# Patient Record
Sex: Male | Born: 1960
Health system: Southern US, Community
[De-identification: ages and names within clinical notes are randomized; demographics above are authoritative.]

## PROBLEM LIST (undated history)

## (undated) DIAGNOSIS — R5383 Other fatigue: Secondary | ICD-10-CM

## (undated) DIAGNOSIS — G4733 Obstructive sleep apnea (adult) (pediatric): Secondary | ICD-10-CM

## (undated) DIAGNOSIS — I480 Paroxysmal atrial fibrillation: Secondary | ICD-10-CM

## (undated) DIAGNOSIS — F419 Anxiety disorder, unspecified: Secondary | ICD-10-CM

## (undated) DIAGNOSIS — R42 Dizziness and giddiness: Secondary | ICD-10-CM

## (undated) DIAGNOSIS — E669 Obesity, unspecified: Secondary | ICD-10-CM

## (undated) DIAGNOSIS — Z789 Other specified health status: Secondary | ICD-10-CM

## (undated) DIAGNOSIS — K219 Gastro-esophageal reflux disease without esophagitis: Secondary | ICD-10-CM

## (undated) DIAGNOSIS — J302 Other seasonal allergic rhinitis: Secondary | ICD-10-CM

## (undated) DIAGNOSIS — E785 Hyperlipidemia, unspecified: Secondary | ICD-10-CM

## (undated) DIAGNOSIS — I483 Typical atrial flutter: Secondary | ICD-10-CM

## (undated) DIAGNOSIS — I5189 Other ill-defined heart diseases: Secondary | ICD-10-CM

## (undated) HISTORY — DX: Anxiety disorder, unspecified: F41.9

## (undated) HISTORY — DX: Other ill-defined heart diseases: I51.89

## (undated) HISTORY — DX: Other specified health status: Z78.9

## (undated) HISTORY — PX: COLONOSCOPY: SHX174

## (undated) HISTORY — DX: Dizziness and giddiness: R42

## (undated) HISTORY — DX: Other seasonal allergic rhinitis: J30.2

## (undated) HISTORY — DX: Typical atrial flutter: I48.3

## (undated) HISTORY — DX: Paroxysmal atrial fibrillation: I48.0

## (undated) HISTORY — DX: Obesity, unspecified: E66.9

## (undated) HISTORY — DX: Obstructive sleep apnea (adult) (pediatric): G47.33

## (undated) HISTORY — DX: Hyperlipidemia, unspecified: E78.5

## (undated) HISTORY — DX: Other fatigue: R53.83

---

## 2010-09-30 HISTORY — PX: CARDIAC CATHETERIZATION: SHX172

## 2010-09-30 HISTORY — PX: CHEST TUBE INSERTION: SHX231

## 2011-01-16 ENCOUNTER — Emergency Department (HOSPITAL_COMMUNITY): Payer: PRIVATE HEALTH INSURANCE

## 2011-01-16 ENCOUNTER — Emergency Department (HOSPITAL_COMMUNITY)
Admission: EM | Admit: 2011-01-16 | Discharge: 2011-01-17 | Disposition: A | Discharge status: Home / Self Care | Payer: PRIVATE HEALTH INSURANCE | Attending: Emergency Medicine | Admitting: Emergency Medicine

## 2011-01-16 DIAGNOSIS — I498 Other specified cardiac arrhythmias: Secondary | ICD-10-CM | POA: Insufficient documentation

## 2011-01-16 DIAGNOSIS — R0602 Shortness of breath: Secondary | ICD-10-CM | POA: Insufficient documentation

## 2011-01-16 DIAGNOSIS — R079 Chest pain, unspecified: Secondary | ICD-10-CM | POA: Insufficient documentation

## 2011-01-16 LAB — CBC
Platelets: 204 10*3/uL (ref 150–400)
RBC: 5.14 MIL/uL (ref 4.22–5.81)
RDW: 12.5 % (ref 11.5–15.5)
WBC: 7.6 10*3/uL (ref 4.0–10.5)

## 2011-01-16 LAB — COMPREHENSIVE METABOLIC PANEL
Albumin: 4.1 g/dL (ref 3.5–5.2)
Alkaline Phosphatase: 67 U/L (ref 39–117)
BUN: 16 mg/dL (ref 6–23)
Calcium: 9.6 mg/dL (ref 8.4–10.5)
Creatinine, Ser: 0.96 mg/dL (ref 0.4–1.5)
Glucose, Bld: 102 mg/dL — ABNORMAL HIGH (ref 70–99)
Potassium: 3.5 mEq/L (ref 3.5–5.1)
Total Protein: 7.2 g/dL (ref 6.0–8.3)

## 2011-01-16 LAB — POCT CARDIAC MARKERS
CKMB, poc: 2.6 ng/mL (ref 1.0–8.0)
Myoglobin, poc: 71 ng/mL (ref 12–200)
Troponin i, poc: <0.05 ng/mL (ref 0.00–0.09)

## 2011-01-16 LAB — LIPASE, BLOOD: Lipase: 36 U/L (ref 11–59)

## 2011-01-16 LAB — DIFFERENTIAL
Basophils Absolute: 0 10*3/uL (ref 0.0–0.1)
Basophils Relative: 0 % (ref 0–1)
Eosinophils Absolute: 0.2 10*3/uL (ref 0.0–0.7)
Eosinophils Relative: 3 % (ref 0–5)
Neutrophils Relative %: 50 % (ref 43–77)

## 2011-01-17 ENCOUNTER — Telehealth: Payer: Self-pay | Admitting: Internal Medicine

## 2011-01-17 LAB — POCT CARDIAC MARKERS
Myoglobin, poc: 49.6 ng/mL (ref 12–200)
Troponin i, poc: <0.05 ng/mL (ref 0.00–0.09)

## 2011-01-17 NOTE — Telephone Encounter (Signed)
Reviewed hospital ED note which states pt needs to have a stress test. Note does not indicate if this is a regular stress test or nuclear stress test.  Will check with schedulers tomorrow to see if voicemail indicates type of test to be done.  I called pt and told him we would determine tomorrow what type of stress test he needs and call him to schedule.  Pt's phone number is [phone]

## 2011-01-18 NOTE — Telephone Encounter (Signed)
I will forward this information to Dr Gala Romney to review and make recommendation about what type of stress test needs to be performed. I spoke with the schedulers and they did not receive any after hour messages about this patient.

## 2011-01-23 NOTE — Telephone Encounter (Signed)
Per Dr Gala Romney pt needs a GXT can be done w/PA, order placed

## 2011-01-26 ENCOUNTER — Encounter: Payer: Self-pay | Admitting: Physician Assistant

## 2011-02-01 ENCOUNTER — Encounter: Payer: Self-pay | Admitting: *Deleted

## 2011-02-01 ENCOUNTER — Ambulatory Visit (INDEPENDENT_AMBULATORY_CARE_PROVIDER_SITE_OTHER): Payer: PRIVATE HEALTH INSURANCE | Admitting: Physician Assistant

## 2011-02-01 ENCOUNTER — Encounter: Payer: Self-pay | Admitting: Physician Assistant

## 2011-02-01 DIAGNOSIS — R079 Chest pain, unspecified: Secondary | ICD-10-CM | POA: Insufficient documentation

## 2011-02-01 DIAGNOSIS — R9439 Abnormal result of other cardiovascular function study: Secondary | ICD-10-CM | POA: Insufficient documentation

## 2011-02-01 LAB — CBC WITH DIFFERENTIAL/PLATELET
Basophils Absolute: 0 10*3/uL (ref 0.0–0.1)
Basophils Relative: 0.4 % (ref 0.0–3.0)
Eosinophils Absolute: 0.1 10*3/uL (ref 0.0–0.7)
Lymphocytes Relative: 35.4 % (ref 12.0–46.0)
MCHC: 35 g/dL (ref 30.0–36.0)
Monocytes Relative: 8.8 % (ref 3.0–12.0)
Neutrophils Relative %: 53 % (ref 43.0–77.0)
RBC: 4.98 Mil/uL (ref 4.22–5.81)
RDW: 13.4 % (ref 11.5–14.6)

## 2011-02-01 LAB — BASIC METABOLIC PANEL
CO2: 24 mEq/L (ref 19–32)
Calcium: 9.8 mg/dL (ref 8.4–10.5)
Creatinine, Ser: 1.3 mg/dL (ref 0.4–1.5)
GFR: 65.47 mL/min (ref >60.00)

## 2011-02-01 LAB — PROTIME-INR: INR: 1.1 ratio — ABNORMAL HIGH (ref 0.8–1.0)

## 2011-02-01 MED ORDER — NITROGLYCERIN 0.4 MG SL SUBL: 0.4000 mg | SUBLINGUAL_TABLET | SUBLINGUAL | Status: DC | PRN

## 2011-02-01 MED ORDER — METOPROLOL TARTRATE 25 MG PO TABS: 25.0000 mg | ORAL_TABLET | Freq: Two times a day (BID) | ORAL | Status: DC

## 2011-02-01 NOTE — Progress Notes (Signed)
Exercise Treadmill Test Patient seen in ED with chest pain.  Cardiac enzymes negative.  He was referred today for ETT.    Past Medical History  Diagnosis Date  . Seasonal allergies     No past surgical history on file.  No Known Allergies  History  Substance Use Topics  . Smoking status: Never Smoker   . Smokeless tobacco: Not on file  . Alcohol Use: No    Family History  Problem Relation Age of Onset  . Coronary artery disease    . Heart attack Father 37  . Heart disease Father   . Diabetes type II Maternal Grandfather     Review of Systems - No chest pain since ER visit.  All other systems reviewed and negative.  PHYSICAL EXAM: Well nourished, well developed, in no acute distress HEENT: normal Neck: no JVD Cardiac:  normal S1, S2; RRR; no murmur Lungs:  clear to auscultation bilaterally, no wheezing, rhonchi or rales Abd: soft, nontender Ext: no edema Skin: warm and dry Neuro:  CNs 2-12 intact, no focal abnormalities noted   Pre-Exercise Testing Evaluation Rhythm: normal sinus  Rate: 72   PR:  .19 QRS:  .09    Test  Exercise Tolerance Test Ordering MD: Arvilla Meres, MD  Interpreting MD:  Tereso Newcomer PC-C  Unique Test No: 1  Treadmill:  1  Indication for ETT: chest pain - rule out ischemia  Contraindication to ETT: No   Stress Modality: exercise - treadmill  Cardiac Imaging Performed: non   Protocol: standard Bruce - maximal  Max BP:  180/79  Max MPHR (bpm):  172 85% MPR (bpm):  146  MPHR obtained (bpm):  160 % MPHR obtained:  95%  Reached 85% MPHR (min:sec):  7:03 Total Exercise Time (min-sec):  9:00  Workload in METS:  10.4 Borg Scale: 15  Reason ETT Terminated:  patient's desire to stop    ST Segment Analysis At Rest: non-specific ST segment slurring With Exercise: significant ischemic ST depression  Other Information Arrhythmia:  No Angina during ETT:  present (1) Quality of ETT:  diagnostic  ETT Interpretation:  abnormal - evidence of ST  depression consistent with ischemia  Comments: Good exercise tolerance. Hypertensive blood pressure response. Patient had chest pressure at end of test and some nausea. 1-2 mm ST depression in V3-5 with maximal exercise. EKG changes resolved in recovery as well as symptoms.  Recommendations: Patient will need cardiac cath. Discussed with Dr. Gala Romney who reviewed EKGs. Risks and benefits of cardiac catheterization have been discussed with the patient.  These include bleeding, infection, kidney damage, stroke, heart attack, death.  The patient understands these risks and is willing to proceed.

## 2011-02-01 NOTE — Patient Instructions (Signed)
Labs today Your physician has requested that you have a cardiac catheterization. Cardiac catheterization is used to diagnose and/or treat various heart conditions. Doctors may recommend this procedure for a number of different reasons. The most common reason is to evaluate chest pain. Chest pain can be a symptom of coronary artery disease (CAD), and cardiac catheterization can show whether plaque is narrowing or blocking your heart's arteries. This procedure is also used to evaluate the valves, as well as measure the blood flow and oxygen levels in different parts of your heart. For further information please visit [email or web address] Please follow instruction sheet, as given.

## 2011-02-01 NOTE — Assessment & Plan Note (Signed)
As noted, ETT positive. Proceed with cardiac cath. Will start on ASA, metoprolol and NTG prn.

## 2011-02-05 ENCOUNTER — Inpatient Hospital Stay (HOSPITAL_BASED_OUTPATIENT_CLINIC_OR_DEPARTMENT_OTHER)
Admission: RE | Admit: 2011-02-05 | Discharge: 2011-02-05 | Disposition: A | Discharge status: Home / Self Care | Payer: PRIVATE HEALTH INSURANCE | Source: Ambulatory Visit | Attending: Internal Medicine | Admitting: Internal Medicine

## 2011-02-05 DIAGNOSIS — I2510 Atherosclerotic heart disease of native coronary artery without angina pectoris: Secondary | ICD-10-CM | POA: Insufficient documentation

## 2011-02-05 DIAGNOSIS — R079 Chest pain, unspecified: Secondary | ICD-10-CM | POA: Insufficient documentation

## 2011-02-05 DIAGNOSIS — Z8249 Family history of ischemic heart disease and other diseases of the circulatory system: Secondary | ICD-10-CM | POA: Insufficient documentation

## 2011-02-05 DIAGNOSIS — R9439 Abnormal result of other cardiovascular function study: Secondary | ICD-10-CM | POA: Insufficient documentation

## 2011-02-19 ENCOUNTER — Encounter: Payer: Self-pay | Admitting: *Deleted

## 2011-02-20 ENCOUNTER — Encounter: Payer: PRIVATE HEALTH INSURANCE | Admitting: Physician Assistant

## 2011-02-20 NOTE — Cardiovascular Report (Signed)
  NAME:  Larry Mccormick, Larry Mccormick          ACCOUNT NO.:  [id number]  MEDICAL RECORD NO.:  [id number]           PATIENT TYPE:  E  LOCATION:  [facility]LED                         FACILITY[facility]  [facility]  PHYSICIAN:  Bevelyn Buckles. Bensimhon, MDDATE OF BIRTH:  01-08-1961  DATE OF PROCEDURE:  02/05/2011 DATE OF DISCHARGE:  01/17/2011                           CARDIAC CATHETERIZATION   PATIENT IDENTIFICATION:  Larry Mccormick is a 50 year old male who was seen in the emergency room with chest pain.  He has a family history of coronary artery disease.  We set him up for an outpatient stress test, which was mildly positive.  He is thus referred for cardiac catheterization.  PROCEDURES PERFORMED: 1. Selective coronary angiography. 2. Left heart cath. 3. Left ventriculogram.  DESCRIPTION OF PROCEDURE:  The risks and indications were explained. Consent was signed and placed on the chart.  Allen's test was checked which was normal on the right.  We then prepped and draped the right wrist area in routine sterile fashion, anesthetized with 1% local lidocaine.  A 5-French arterial sheath was placed using modified Seldinger technique.  Once the sheath was in, we gave him 5000 units of systemic heparin and 3 mg of intra-arterial verapamil.  We used a JL- 3.5, a Williams right, and a straight pigtail catheter.  All catheter exchanges were made over wire.  There were no apparent complications.  Central aortic pressure 96/64 with a mean of 79.  LV pressure 98/19 with EDP of 23.  There was no aortic stenosis.  Left main was normal.  LAD was a long vessel coursing the apex.  It gave off a tiny diagonal branch, it was angiographically normal.  Left circumflex gave off a ramus branch and three marginal branches, it was angiographically normal.  Right coronary artery was a large dominant vessel, which appeared to have an anterior takeoff.  It was difficult to cannulate.  Most of the images were taken nonselectively.  It  gave off a large acute marginal PDA.  There was a 20% lesion proximally with somewhat of a bend proximally.  There did not appear to be a high-grade stenosis. Otherwise, the right coronary artery was normal.  Left ventriculogram done in the RAO position showed an EF of 55% with no regional wall motion abnormalities.  ASSESSMENT: 1. Minimal nonobstructive coronary artery disease. 2. Normal left ventricular function with mildly increased left     ventricular end-diastolic pressure.  PLAN/DISCUSSION:  Based on his catheterization, I think his stress test is likely a false positive.  We will continue with aggressive risk factor management.     Bevelyn Buckles. Bensimhon, MD     DRB/MEDQ  D:  02/05/2011  T:  02/06/2011  Job:  [id number]  Electronically Signed by Arvilla Meres MD on 02/20/2011 09:33:22 PM

## 2013-08-04 ENCOUNTER — Encounter: Payer: Self-pay | Admitting: Cardiology

## 2013-08-04 ENCOUNTER — Encounter: Payer: Self-pay | Admitting: *Deleted

## 2013-08-04 DIAGNOSIS — E785 Hyperlipidemia, unspecified: Secondary | ICD-10-CM | POA: Insufficient documentation

## 2013-08-04 DIAGNOSIS — G4733 Obstructive sleep apnea (adult) (pediatric): Secondary | ICD-10-CM | POA: Insufficient documentation

## 2013-08-04 DIAGNOSIS — F419 Anxiety disorder, unspecified: Secondary | ICD-10-CM | POA: Insufficient documentation

## 2013-08-04 DIAGNOSIS — R42 Dizziness and giddiness: Secondary | ICD-10-CM | POA: Insufficient documentation

## 2013-08-05 ENCOUNTER — Encounter: Payer: Self-pay | Admitting: Cardiology

## 2013-08-05 ENCOUNTER — Ambulatory Visit (INDEPENDENT_AMBULATORY_CARE_PROVIDER_SITE_OTHER): Payer: BC Managed Care – PPO | Admitting: Cardiology

## 2013-08-05 VITALS — BP 120/75 | HR 66 | Ht 71.0 in | Wt 225.0 lb

## 2013-08-05 DIAGNOSIS — E669 Obesity, unspecified: Secondary | ICD-10-CM

## 2013-08-05 DIAGNOSIS — I480 Paroxysmal atrial fibrillation: Secondary | ICD-10-CM | POA: Insufficient documentation

## 2013-08-05 DIAGNOSIS — G4733 Obstructive sleep apnea (adult) (pediatric): Secondary | ICD-10-CM

## 2013-08-05 DIAGNOSIS — I4891 Unspecified atrial fibrillation: Secondary | ICD-10-CM

## 2013-08-05 MED ORDER — METOPROLOL TARTRATE 25 MG PO TABS: 12.5000 mg | ORAL_TABLET | Freq: Two times a day (BID) | ORAL | Status: DC

## 2013-08-05 NOTE — Progress Notes (Signed)
[address] [place], [place]  [address] Phone: [phone] Fax:  [phone]  Date:  08/05/2013   ID:  Larry Mccormick, DOB 07/30/1961, MRN [id number]  PCP:  Mickie Hillier, MD  Cardiologist:  Armanda Magic, MD     History of Present Illness: Larry Mccormick is a 52 y.o. male with a history of OSA on CPAP, obesity and PAF.  He is doing well.  He denies any chest pain, SOB, DOE, LE edema, dizziness, palpitations or syncope.  He tolerates his CPAP well.  He tolerates the nasal mask and feels the pressure is adequate.  He has no daytime sleepiness but has not felt as rested when he gets up in the am as he had in the past.  He says that he has been snoring some and has not been sleeping as well.  He says that the mask is leaking some and the strap is getting too loose.  He has not been using his chin strap.   Wt Readings from Last 3 Encounters:  No data found for Wt     Past Medical History  Diagnosis Date  . Seasonal allergies   . Chest pain   . Anxiety   . OSA (obstructive sleep apnea)      on CPAP machine, follows with Dr. Mayford Knife  . Hyperlipidemia   . Atrial fibrillation     08/2011 [facility]- transient, following with Dr. Mayford Knife  . Vertigo   . Sleep apnea     Current Outpatient Prescriptions  Medication Sig Dispense Refill  . aspirin 81 MG tablet Take 81 mg by mouth as needed.        . DiphenhydrAMINE HCl (BENADRYL ALLERGY PO) Take by mouth as needed.        . metoprolol tartrate (LOPRESSOR) 25 MG tablet Take 1 tablet (25 mg total) by mouth 2 (two) times daily.  60 tablet  11  . nitroGLYCERIN (NITROSTAT) 0.4 MG SL tablet Place 1 tablet (0.4 mg total) under the tongue every 5 (five) minutes as needed for chest pain.  25 tablet  11   No current facility-administered medications for this visit.    Allergies:    Allergies  Allergen Reactions  . Succinylcholine Chloride Anaphylaxis  . Vicodin [Hydrocodone-Acetaminophen] Nausea And Vomiting      Social History:  The patient  reports that he has never smoked. He does not have any smokeless tobacco history on file. He reports that he does not drink alcohol or use illicit drugs.   Family History:  The patient's family history includes Coronary artery disease in an other family member; Diabetes type II in his maternal grandfather; Heart attack (age of onset: 60) in his father; Heart disease in his father.   ROS:  Please see the history of present illness.      All other systems reviewed and negative.   PHYSICAL EXAM: VS:  There were no vitals taken for this visit. Well nourished, well developed, in no acute distress HEENT: normal Neck: no JVD Cardiac:  normal S1, S2; RRR; no murmur Lungs:  clear to auscultation bilaterally, no wheezing, rhonchi or rales Abd: soft, nontender, no hepatomegaly Ext: no edema Skin: warm and dry Neuro:  CNs 2-12 intact, no focal abnormalities noted  EKG:       ASSESSMENT AND PLAN:  1. OSA on CPAP - his d/l today showed an AHI of 1.2/hr and 96% compliance in using more than 4 hours nightly  - I have  recommended that he take his mask to [facility] and see if he needs a new cushion as well as new straps.    - I encouraged him to use his chin strap and hopefully his snoring will resolve - if it doesn't I ask him to let me know 2. PAF with no reoccurence  - continue Metoprolol 3. Obesity   - I have encouraged him to try to get at least 30 -45 minutes of aerobic exercise 5 times weekly  Followup with me in 6 months  Signed, Armanda Magic, MD 08/05/2013 10:30 AM

## 2013-08-05 NOTE — Patient Instructions (Signed)
Your physician wants you to follow-up in: 6 months with Dr. Turner. You will receive a reminder letter in the mail two months in advance. If you don't receive a letter, please call our office to schedule the follow-up appointment.  

## 2013-08-13 ENCOUNTER — Telehealth: Payer: Self-pay | Admitting: Cardiology

## 2013-08-13 ENCOUNTER — Other Ambulatory Visit: Payer: Self-pay | Admitting: *Deleted

## 2013-08-13 MED ORDER — METOPROLOL TARTRATE 25 MG PO TABS: 12.5000 mg | ORAL_TABLET | Freq: Two times a day (BID) | ORAL | Status: DC

## 2013-08-13 NOTE — Telephone Encounter (Deleted)
Error:  Transferred to pt advocate team

## 2013-08-16 ENCOUNTER — Encounter: Payer: Self-pay | Admitting: Cardiology

## 2013-08-16 NOTE — Telephone Encounter (Signed)
Please let patient know that CPAP download was fine

## 2013-08-17 NOTE — Telephone Encounter (Signed)
Pt.notified

## 2013-09-29 ENCOUNTER — Other Ambulatory Visit: Payer: Self-pay | Admitting: General Surgery

## 2013-09-29 DIAGNOSIS — G4733 Obstructive sleep apnea (adult) (pediatric): Secondary | ICD-10-CM

## 2014-03-01 ENCOUNTER — Other Ambulatory Visit: Payer: Self-pay | Admitting: Orthopedic Surgery

## 2014-03-02 ENCOUNTER — Encounter (HOSPITAL_BASED_OUTPATIENT_CLINIC_OR_DEPARTMENT_OTHER): Payer: Self-pay | Admitting: *Deleted

## 2014-03-02 NOTE — Progress Notes (Signed)
To come in for ekg-bmet-hx af post fall-cath 2012-normal

## 2014-03-02 NOTE — Progress Notes (Signed)
To bring cpap and will use post op 

## 2014-03-03 ENCOUNTER — Other Ambulatory Visit: Payer: Self-pay

## 2014-03-03 ENCOUNTER — Encounter (HOSPITAL_BASED_OUTPATIENT_CLINIC_OR_DEPARTMENT_OTHER)
Admission: RE | Admit: 2014-03-03 | Discharge: 2014-03-03 | Disposition: A | Discharge status: Home / Self Care | Payer: BC Managed Care – PPO | Source: Ambulatory Visit | Attending: Orthopedic Surgery | Admitting: Orthopedic Surgery

## 2014-03-03 DIAGNOSIS — Z01810 Encounter for preprocedural cardiovascular examination: Secondary | ICD-10-CM | POA: Insufficient documentation

## 2014-03-03 LAB — BASIC METABOLIC PANEL
BUN: 15 mg/dL (ref 6–23)
CALCIUM: 9.8 mg/dL (ref 8.4–10.5)
CO2: 27 meq/L (ref 19–32)
CREATININE: 1 mg/dL (ref 0.50–1.35)
Chloride: 103 mEq/L (ref 96–112)
GFR calc Af Amer: >90 mL/min (ref >90)
GFR calc non Af Amer: 85 mL/min — ABNORMAL LOW (ref >90)
Glucose, Bld: 83 mg/dL (ref 70–99)
Potassium: 4.2 mEq/L (ref 3.7–5.3)
Sodium: 140 mEq/L (ref 137–147)

## 2014-03-03 NOTE — H&P (Signed)
Larry Mccormick is an 53 y.o. male.   Chief Complaint: Left knee pain  HPI: Larry Mccormick presents with a chief complaint of left knee pain.  Larry Mccormick states that she's noticed increasing left knee pain over the past several weeks.  Larry Mccormick states that Larry Mccormick's had constant pain at times and severe pain.  Larry Mccormick describes as throbbing.  Larry Mccormick has noticed some swelling which comes and goes.  It is worse with activity and wakes him from sleep at times.  Larry Mccormick has noticed that the meloxicam helps more so with his right knee pain but not with his left knee pain.  Larry Mccormick does have a prior history of right knee injury.  Past Medical History  Diagnosis Date  . Seasonal allergies   . Chest pain   . Anxiety   . OSA (obstructive sleep apnea)     PSG AHI 31.71/hr now on CPAP at 9cm H2O  . Hyperlipidemia   . Vertigo   . Sleep apnea   . Obesity (BMI 30-39.9)   . Atrial fibrillation     [facility] following with Dr. Mayford Knifeurner  . Paroxysmal atrial fibrillation   . Diastolic dysfunction   . Hypertension   . Dysrhythmia     hx PAF-2012  . GERD (gastroesophageal reflux disease)     occ pepcid    Past Surgical History  Procedure Laterality Date  . Chest tube insertion  2012    right post a fall-no problems post  . Colonoscopy      no problems post  . Cardiac catheterization  2012    nonobstructive ASCAD     Family History  Problem Relation Age of Onset  . Coronary artery disease    . Heart attack Father [id number]  . Heart disease Father   . Diabetes type II Maternal Grandfather    Social History:  reports that Larry Mccormick has never smoked. Larry Mccormick does not have any smokeless tobacco history on file. Larry Mccormick reports that Larry Mccormick drinks alcohol. Larry Mccormick reports that Larry Mccormick does not use illicit drugs.  Allergies:  Allergies  Allergen Reactions  . Succinylcholine Chloride Anaphylaxis    After cath 2012-felt very weak and bp dropped  . Vicodin [Hydrocodone-Acetaminophen] Nausea And Vomiting    No prescriptions prior  to admission    No results found for this or any previous visit (from the past 48 hour(s)). No results found.  Review of Systems  Constitutional: Negative.   HENT: Negative.   Eyes: Negative.   Respiratory:       Sleep apnea  Cardiovascular: Negative.   Gastrointestinal: Negative.   Genitourinary: Negative.   Musculoskeletal: Positive for joint pain.  Skin: Negative.   Neurological: Negative.   Endo/Heme/Allergies: Negative.   Psychiatric/Behavioral: Negative.     There were no vitals taken for this visit. Physical Exam  Constitutional: Larry Mccormick is oriented to person, place, and time. Larry Mccormick appears well-developed and well-nourished.  HENT:  Head: Normocephalic and atraumatic.  Eyes: Pupils are equal, round, and reactive to light.  Neck: Normal range of motion. Neck supple.  Cardiovascular: Intact distal pulses.   Respiratory: Effort normal.  Musculoskeletal: Larry Mccormick exhibits tenderness.  Today, the Larry Mccormick has a range from 0-125.  Larry Mccormick does have a mild effusion.  No erythema or warmth.  Larry Mccormick continues to have medial joint line tenderness.  Neurological: Larry Mccormick is alert and oriented to person, place, and time.  Skin: Skin is warm and dry.  Psychiatric: Larry Mccormick has a normal mood and affect. His behavior is normal. Judgment  and thought content normal.     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  Larry Mccormick wishes to proceed with surgery.  The benefits risks and potential complications of surgery are discussed with the Larry Mccormick.  This includes injury to nerves and tissue, infection, and postoperative blood clots.  Larry Mccormick wishes to proceed with surgery.  A posting slip is completed and the Larry Mccormick is to discuss scheduling with Agustin Cree.  Call with any issues.  Larry Mccormick is given a prescription for tramadol 50 mg twice daily as needed for pain.  Allena Katz 03/03/2014, 10:28 AM

## 2014-03-07 ENCOUNTER — Ambulatory Visit (HOSPITAL_BASED_OUTPATIENT_CLINIC_OR_DEPARTMENT_OTHER): Payer: BC Managed Care – PPO | Admitting: Anesthesiology

## 2014-03-07 ENCOUNTER — Encounter (HOSPITAL_BASED_OUTPATIENT_CLINIC_OR_DEPARTMENT_OTHER): Payer: Self-pay | Admitting: *Deleted

## 2014-03-07 ENCOUNTER — Encounter (HOSPITAL_BASED_OUTPATIENT_CLINIC_OR_DEPARTMENT_OTHER): Payer: BC Managed Care – PPO | Admitting: Anesthesiology

## 2014-03-07 ENCOUNTER — Encounter (HOSPITAL_BASED_OUTPATIENT_CLINIC_OR_DEPARTMENT_OTHER)
Admission: RE | Disposition: A | Discharge status: Home / Self Care | Payer: Self-pay | Source: Ambulatory Visit | Attending: Orthopedic Surgery

## 2014-03-07 ENCOUNTER — Ambulatory Visit (HOSPITAL_BASED_OUTPATIENT_CLINIC_OR_DEPARTMENT_OTHER)
Admission: RE | Admit: 2014-03-07 | Discharge: 2014-03-07 | Disposition: A | Discharge status: Home / Self Care | Payer: BC Managed Care – PPO | Source: Ambulatory Visit | Attending: Orthopedic Surgery | Admitting: Orthopedic Surgery

## 2014-03-07 DIAGNOSIS — K219 Gastro-esophageal reflux disease without esophagitis: Secondary | ICD-10-CM | POA: Insufficient documentation

## 2014-03-07 DIAGNOSIS — F411 Generalized anxiety disorder: Secondary | ICD-10-CM | POA: Insufficient documentation

## 2014-03-07 DIAGNOSIS — Z6831 Body mass index (BMI) 31.0-31.9, adult: Secondary | ICD-10-CM | POA: Insufficient documentation

## 2014-03-07 DIAGNOSIS — Z885 Allergy status to narcotic agent status: Secondary | ICD-10-CM | POA: Insufficient documentation

## 2014-03-07 DIAGNOSIS — Z888 Allergy status to other drugs, medicaments and biological substances status: Secondary | ICD-10-CM | POA: Insufficient documentation

## 2014-03-07 DIAGNOSIS — I4891 Unspecified atrial fibrillation: Secondary | ICD-10-CM | POA: Insufficient documentation

## 2014-03-07 DIAGNOSIS — G4733 Obstructive sleep apnea (adult) (pediatric): Secondary | ICD-10-CM | POA: Insufficient documentation

## 2014-03-07 DIAGNOSIS — S83207A Unspecified tear of unspecified meniscus, current injury, left knee, initial encounter: Secondary | ICD-10-CM

## 2014-03-07 DIAGNOSIS — IMO0002 Reserved for concepts with insufficient information to code with codable children: Secondary | ICD-10-CM | POA: Insufficient documentation

## 2014-03-07 DIAGNOSIS — M2240 Chondromalacia patellae, unspecified knee: Secondary | ICD-10-CM | POA: Insufficient documentation

## 2014-03-07 DIAGNOSIS — E785 Hyperlipidemia, unspecified: Secondary | ICD-10-CM | POA: Insufficient documentation

## 2014-03-07 DIAGNOSIS — I10 Essential (primary) hypertension: Secondary | ICD-10-CM | POA: Insufficient documentation

## 2014-03-07 HISTORY — DX: Gastro-esophageal reflux disease without esophagitis: K21.9

## 2014-03-07 HISTORY — PX: KNEE ARTHROSCOPY: SHX127

## 2014-03-07 LAB — POCT HEMOGLOBIN-HEMACUE: Hemoglobin: 15.4 g/dL (ref 13.0–17.0)

## 2014-03-07 SURGERY — ARTHROSCOPY, KNEE
Anesthesia: General | Site: Knee | Laterality: Left

## 2014-03-07 MED ORDER — PROPOFOL INFUSION 10 MG/ML OPTIME
INTRAVENOUS | Status: DC | PRN
  Administered 2014-03-07: 150 mL via INTRAVENOUS

## 2014-03-07 MED ORDER — FENTANYL CITRATE 0.05 MG/ML IJ SOLN
INTRAMUSCULAR | Status: AC
  Filled 2014-03-07: qty 6

## 2014-03-07 MED ORDER — LACTATED RINGERS IV SOLN
INTRAVENOUS | Status: DC
  Administered 2014-03-07: 13:00:00 via INTRAVENOUS

## 2014-03-07 MED ORDER — CEFAZOLIN SODIUM-DEXTROSE 2-3 GM-% IV SOLR
INTRAVENOUS | Status: AC
  Filled 2014-03-07: qty 50

## 2014-03-07 MED ORDER — KETOROLAC TROMETHAMINE 30 MG/ML IJ SOLN
INTRAMUSCULAR | Status: DC | PRN
  Administered 2014-03-07: 30 mg via INTRAVENOUS

## 2014-03-07 MED ORDER — ONDANSETRON HCL 4 MG/2ML IJ SOLN
INTRAMUSCULAR | Status: DC | PRN
  Administered 2014-03-07 (×2): 4 mg via INTRAVENOUS

## 2014-03-07 MED ORDER — CEFAZOLIN SODIUM-DEXTROSE 2-3 GM-% IV SOLR
2.0000 g | INTRAVENOUS | Status: AC
  Administered 2014-03-07: 2 g via INTRAVENOUS

## 2014-03-07 MED ORDER — FENTANYL CITRATE 0.05 MG/ML IJ SOLN: 50.0000 ug | INTRAMUSCULAR | Status: DC | PRN

## 2014-03-07 MED ORDER — BUPIVACAINE-EPINEPHRINE 0.5% -1:200000 IJ SOLN
INTRAMUSCULAR | Status: DC | PRN
  Administered 2014-03-07: 20 mL

## 2014-03-07 MED ORDER — MIDAZOLAM HCL 5 MG/5ML IJ SOLN
INTRAMUSCULAR | Status: DC | PRN
  Administered 2014-03-07: 2 mg via INTRAVENOUS

## 2014-03-07 MED ORDER — OXYCODONE-ACETAMINOPHEN 5-325 MG PO TABS: 1.0000 | ORAL_TABLET | ORAL | Status: DC | PRN

## 2014-03-07 MED ORDER — ONDANSETRON HCL 4 MG/2ML IJ SOLN: 4.0000 mg | Freq: Once | INTRAMUSCULAR | Status: DC | PRN

## 2014-03-07 MED ORDER — FENTANYL CITRATE 0.05 MG/ML IJ SOLN
INTRAMUSCULAR | Status: DC | PRN
  Administered 2014-03-07 (×2): 100 ug via INTRAVENOUS

## 2014-03-07 MED ORDER — OXYCODONE HCL 5 MG/5ML PO SOLN: 5.0000 mg | Freq: Once | ORAL | Status: AC | PRN

## 2014-03-07 MED ORDER — MIDAZOLAM HCL 2 MG/2ML IJ SOLN
INTRAMUSCULAR | Status: AC
  Filled 2014-03-07: qty 2

## 2014-03-07 MED ORDER — OXYCODONE HCL 5 MG PO TABS
ORAL_TABLET | ORAL | Status: AC
  Filled 2014-03-07: qty 1

## 2014-03-07 MED ORDER — HYDROMORPHONE HCL PF 1 MG/ML IJ SOLN: 0.2500 mg | INTRAMUSCULAR | Status: DC | PRN

## 2014-03-07 MED ORDER — MIDAZOLAM HCL 2 MG/2ML IJ SOLN
1.0000 mg | INTRAMUSCULAR | Status: DC | PRN
Start: 2014-03-07 — End: 2014-03-07

## 2014-03-07 MED ORDER — DEXTROSE-NACL 5-0.45 % IV SOLN: INTRAVENOUS | Status: DC

## 2014-03-07 MED ORDER — LIDOCAINE HCL (CARDIAC) 20 MG/ML IV SOLN
INTRAVENOUS | Status: DC | PRN
  Administered 2014-03-07: 100 mg via INTRAVENOUS

## 2014-03-07 MED ORDER — OXYCODONE HCL 5 MG PO TABS
5.0000 mg | ORAL_TABLET | Freq: Once | ORAL | Status: AC | PRN
  Administered 2014-03-07: 5 mg via ORAL

## 2014-03-07 MED ORDER — DEXAMETHASONE SODIUM PHOSPHATE 10 MG/ML IJ SOLN
INTRAMUSCULAR | Status: DC | PRN
  Administered 2014-03-07: 10 mg via INTRAVENOUS

## 2014-03-07 MED ORDER — EPINEPHRINE HCL 1 MG/ML IJ SOLN
INTRAMUSCULAR | Status: DC | PRN
  Administered 2014-03-07: 1 mg via SUBCUTANEOUS

## 2014-03-07 MED ORDER — CHLORHEXIDINE GLUCONATE 4 % EX LIQD: 60.0000 mL | Freq: Once | CUTANEOUS | Status: DC

## 2014-03-07 MED ORDER — MEPERIDINE HCL 25 MG/ML IJ SOLN: 6.2500 mg | INTRAMUSCULAR | Status: DC | PRN

## 2014-03-07 MED ORDER — SODIUM CHLORIDE 0.9 % IR SOLN
Status: DC | PRN
  Administered 2014-03-07: 1

## 2014-03-07 SURGICAL SUPPLY — 42 items
BANDAGE ELASTIC 6 VELCRO ST LF (GAUZE/BANDAGES/DRESSINGS) ×2 IMPLANT
BLADE 4.2CUDA (BLADE) IMPLANT
BLADE CUTTER GATOR 3.5 (BLADE) ×1 IMPLANT
BLADE GREAT WHITE 4.2 (BLADE) ×1 IMPLANT
CANISTER SUCT 3000ML (MISCELLANEOUS) IMPLANT
DRAPE ARTHROSCOPY W/POUCH 114 (DRAPES) ×2 IMPLANT
DURAPREP 26ML APPLICATOR (WOUND CARE) ×2 IMPLANT
ELECT MENISCUS 165MM 90D (ELECTRODE) IMPLANT
ELECT REM PT RETURN 9FT ADLT (ELECTROSURGICAL)
ELECTRODE REM PT RTRN 9FT ADLT (ELECTROSURGICAL) IMPLANT
GAUZE SPONGE 4X4 12PLY STRL (GAUZE/BANDAGES/DRESSINGS) ×2 IMPLANT
GAUZE XEROFORM 1X8 LF (GAUZE/BANDAGES/DRESSINGS) ×2 IMPLANT
GLOVE BIO SURGEON STRL SZ7.5 (GLOVE) ×2 IMPLANT
GLOVE BIO SURGEON STRL SZ8.5 (GLOVE) ×2 IMPLANT
GLOVE BIOGEL PI IND STRL 7.0 (GLOVE) IMPLANT
GLOVE BIOGEL PI IND STRL 8 (GLOVE) ×1 IMPLANT
GLOVE BIOGEL PI IND STRL 9 (GLOVE) ×1 IMPLANT
GLOVE BIOGEL PI INDICATOR 7.0 (GLOVE) ×1
GLOVE BIOGEL PI INDICATOR 8 (GLOVE) ×1
GLOVE BIOGEL PI INDICATOR 9 (GLOVE) ×1
GLOVE ECLIPSE 6.5 STRL STRAW (GLOVE) ×2 IMPLANT
GOWN STRL REUS W/ TWL LRG LVL3 (GOWN DISPOSABLE) ×1 IMPLANT
GOWN STRL REUS W/ TWL XL LVL3 (GOWN DISPOSABLE) ×2 IMPLANT
GOWN STRL REUS W/TWL LRG LVL3 (GOWN DISPOSABLE) ×2
GOWN STRL REUS W/TWL XL LVL3 (GOWN DISPOSABLE) ×4
IV NS IRRIG 3000ML ARTHROMATIC (IV SOLUTION) ×2 IMPLANT
KNEE WRAP E Z 3 GEL PACK (MISCELLANEOUS) ×2 IMPLANT
MANIFOLD NEPTUNE II (INSTRUMENTS) ×2 IMPLANT
NDL SAFETY ECLIPSE 18X1.5 (NEEDLE) IMPLANT
NEEDLE HYPO 18GX1.5 SHARP (NEEDLE)
PACK ARTHROSCOPY DSU (CUSTOM PROCEDURE TRAY) ×2 IMPLANT
PACK BASIN DAY SURGERY FS (CUSTOM PROCEDURE TRAY) ×2 IMPLANT
PAD ALCOHOL SWAB (MISCELLANEOUS) ×2 IMPLANT
PENCIL BUTTON HOLSTER BLD 10FT (ELECTRODE) IMPLANT
SET ARTHROSCOPY TUBING (MISCELLANEOUS) ×2
SET ARTHROSCOPY TUBING LN (MISCELLANEOUS) ×1 IMPLANT
SLEEVE SCD COMPRESS KNEE MED (MISCELLANEOUS) IMPLANT
SYR 3ML 18GX1 1/2 (SYRINGE) IMPLANT
SYR 5ML LL (SYRINGE) ×2 IMPLANT
TOWEL OR 17X24 6PK STRL BLUE (TOWEL DISPOSABLE) ×2 IMPLANT
WAND STAR VAC 90 (SURGICAL WAND) IMPLANT
WATER STERILE IRR 1000ML POUR (IV SOLUTION) ×1 IMPLANT

## 2014-03-07 NOTE — Anesthesia Preprocedure Evaluation (Addendum)
Anesthesia Evaluation  Patient identified by MRN, date of birth, ID band Patient awake    Reviewed: Allergy & Precautions, H&P , NPO status , Patient's Chart, lab work & pertinent test results  Airway Mallampati: I TM Distance: >3 FB Neck ROM: Full  Mouth opening: Limited Mouth Opening  Dental  (+) Teeth Intact, Dental Advisory Given,    Pulmonary sleep apnea and Continuous Positive Airway Pressure Ventilation ,    Pulmonary exam normal       Cardiovascular hypertension, Pt. on medications + dysrhythmias Atrial Fibrillation Rhythm:Regular Rate:Normal  03-Mar-2014  Normal sinus rhythm Possible Left atrial enlargement Incomplete right bundle branch block Nonspecific T wave abnormality   Neuro/Psych Anxiety    GI/Hepatic GERD-  Medicated and Controlled,  Endo/Other    Renal/GU      Musculoskeletal   Abdominal Normal abdominal exam  (+)   Peds  Hematology   Anesthesia Other Findings + motion sickness  Reproductive/Obstetrics                      Anesthesia Physical Anesthesia Plan  ASA: III  Anesthesia Plan: General   Post-op Pain Management:    Induction: Intravenous  Airway Management Planned: LMA  Additional Equipment:   Intra-op Plan:   Post-operative Plan: Extubation in OR  Informed Consent: I have reviewed the patients History and Physical, chart, labs and discussed the procedure including the risks, benefits and alternatives for the proposed anesthesia with the patient or authorized representative who has indicated his/her understanding and acceptance.   Dental advisory given  Plan Discussed with: CRNA, Surgeon and Anesthesiologist  Anesthesia Plan Comments:        Anesthesia Quick Evaluation

## 2014-03-07 NOTE — Anesthesia Procedure Notes (Signed)
Procedure Name: LMA Insertion Date/Time: 03/07/2014 1:08 PM Performed by: Tyrone Nine Pre-anesthesia Checklist: Patient identified, Timeout performed, Emergency Drugs available, Suction available and Patient being monitored Patient Re-evaluated:Patient Re-evaluated prior to inductionOxygen Delivery Method: Circle system utilized Preoxygenation: Pre-oxygenation with 100% oxygen Intubation Type: IV induction Ventilation: Mask ventilation without difficulty LMA: LMA inserted LMA Size: 4.0 Number of attempts: 1 Placement Confirmation: breath sounds checked- equal and bilateral Tube secured with: Tape Dental Injury: Teeth and Oropharynx as per pre-operative assessment

## 2014-03-07 NOTE — Discharge Instructions (Signed)
°  Post Anesthesia Home Care Instructions  Activity: Get plenty of rest for the remainder of the day. A responsible adult should stay with you for 24 hours following the procedure.  For the next 24 hours, DO NOT: -Drive a car -Advertising copywriter -Drink alcoholic beverages -Take any medication unless instructed by your physician -Make any legal decisions or sign important papers.  Meals: Start with liquid foods such as gelatin or soup. Progress to regular foods as tolerated. Avoid greasy, spicy, heavy foods. If nausea and/or vomiting occur, drink only clear liquids until the nausea and/or vomiting subsides. Call your physician if vomiting continues.  Special Instructions/Symptoms: Your throat may feel dry or sore from the anesthesia or the breathing tube placed in your throat during surgery. If this causes discomfort, gargle with warm salt water. The discomfort should disappear within 24 hours.  Arthroscopic Procedure, Knee An arthroscopic procedure can find what is wrong with your knee. PROCEDURE Arthroscopy is a surgical technique that allows your orthopedic surgeon to diagnose and treat your knee injury with accuracy. They will look into your knee through a small instrument. This is almost like a small (pencil sized) telescope. Because arthroscopy affects your knee less than open knee surgery, you can anticipate a more rapid recovery. Taking an active role by following your caregiver's instructions will help with rapid and complete recovery. Use crutches, rest, elevation, ice, and knee exercises as instructed. The length of recovery depends on various factors including type of injury, age, physical condition, medical conditions, and your rehabilitation. Your knee is the joint between the large bones (femur and tibia) in your leg. Cartilage covers these bone ends which are smooth and slippery and allow your knee to bend and move smoothly. Two menisci, thick, semi-lunar shaped pads of cartilage  which form a rim inside the joint, help absorb shock and stabilize your knee. Ligaments bind the bones together and support your knee joint. Muscles move the joint, help support your knee, and take stress off the joint itself. Because of this all programs and physical therapy to rehabilitate an injured or repaired knee require rebuilding and strengthening your muscles. AFTER THE PROCEDURE  After the procedure, you will be moved to a recovery area until most of the effects of the medication have worn off. Your caregiver will discuss the test results with you.  Only take over-the-counter or prescription medicines for pain, discomfort, or fever as directed by your caregiver. SEEK MEDICAL CARE IF:   You have increased bleeding from your wounds.  You see redness, swelling, or have increasing pain in your wounds.  You have pus coming from your wound.  You have an oral temperature above 102 F (38.9 C).  You notice a bad smell coming from the wound or dressing.  You have severe pain with any motion of your knee. SEEK IMMEDIATE MEDICAL CARE IF:   You develop a rash.  You have difficulty breathing.  You have any allergic problems. Document Released: 09/13/2000 Document Revised: 12/09/2011 Document Reviewed: 04/06/2008 [facility] Patient Information 2014 [place], [place].

## 2014-03-07 NOTE — Op Note (Signed)
Pre-Op Dx: Left knee medial meniscal tear with chondromalacia  Postop Dx: Same   Procedure: Left knee arthroscopic partial medial meniscectomy for a double parrot-beak tear medial meniscus, debridement correlation grade 3 flap tears medial femoral condyle. Debridement chondromalacia grade 2-3 from the apex of the patella.  Surgeon: Feliberto Gottron. Turner Daniels M.D.  Assist: Tomi Likens. Gaylene Brooks  (present throughout entire procedure and necessary for timely completion of the procedure) Anes: General LMA  EBL: Minimal  Fluids: 800 cc   Indications: MRI proven medial meniscal tear the left knee with catching popping and pain as well as chondromalacia the medial femoral condyle.. Pt has failed conservative treatment with anti-inflammatory medicines, physical therapy, and modified activites but did get good temporarily from an intra-articular cortisone injection. Pain has recurred and patient desires elective arthroscopic evaluation and treatment of knee. Risks and benefits of surgery have been discussed and questions answered.  Procedure: Patient identified by arm band and taken to the operating room at the day surgery Center. The appropriate anesthetic monitors were attached, and General LMA anesthesia was induced without difficulty. Lateral post was applied to the table and the lower extremity was prepped and draped in usual sterile fashion from the ankle to the midthigh. Time out procedure was performed. We began the operation by making standard inferior lateral and inferior medial peripatellar portals with a #11 blade allowing introduction of the arthroscope through the inferior lateral portal and the out flow to the inferior medial portal. Pump pressure was set at 100 mmHg and diagnostic arthroscopy  revealed grade 2-3 chondromalacia apex of the patella is debrider back to a stable margin with a 35 Gator sucker shaver. Moving into the medial compartment the medial femoral condyle had grade 3, shows flap tears  again debrider back to a stable margin, the medial meniscus had a complex double parrot-beak tear of the posterior horn going to mid medial and was debrided with straight biters, small biter, and a 3.5 Gator sucker shaver. We then probed the meniscal remnant and it was stable. The anterior cruciate ligament and the PCL are intact. The lateral meniscus and articular cartilage were in good condition. A small amount of chondromalacia near the tibial spine was lightly debrided the. The knee was irrigated out normal saline solution. A dressing of xerofoam 4 x 4 dressing sponges, web roll and an Ace wrap was applied. The patient was awakened extubated and taken to the recovery without difficulty.    Signed: Nestor Lewandowsky, MD

## 2014-03-07 NOTE — Interval H&P Note (Signed)
History and Physical Interval Note:  03/07/2014 12:28 PM  Larry Mccormick  has presented today for surgery, with the diagnosis of LEFT KNEE MEDIAL MENISCUS TEAR  The various methods of treatment have been discussed with the patient and family. After consideration of risks, benefits and other options for treatment, the patient has consented to  Procedure(s): LEFT ARTHROSCOPY KNEE (Left) as a surgical intervention .  The patient's history has been reviewed, patient examined, no change in status, stable for surgery.  I have reviewed the patient's chart and labs.  Questions were answered to the patient's satisfaction.     Nestor Lewandowsky

## 2014-03-07 NOTE — Transfer of Care (Signed)
Immediate Anesthesia Transfer of Care Note  Patient: Larry Mccormick  Procedure(s) Performed: Procedure(s): LEFT ARTHROSCOPY KNEE (Left)  Patient Location: PACU  Anesthesia Type:General  Level of Consciousness: awake, sedated and patient cooperative  Airway & Oxygen Therapy: Patient Spontanous Breathing and Patient connected to nasal cannula oxygen  Post-op Assessment: Report given to PACU RN and Post -op Vital signs reviewed and stable  Post vital signs: Reviewed and stable  Complications: No apparent anesthesia complications

## 2014-03-07 NOTE — Anesthesia Postprocedure Evaluation (Signed)
  Anesthesia Post-op Note  Patient: Larry Mccormick  Procedure(s) Performed: Procedure(s): LEFT ARTHROSCOPY KNEE (Left)  Patient Location: PACU  Anesthesia Type:General  Level of Consciousness: awake and alert   Airway and Oxygen Therapy: Patient Spontanous Breathing  Post-op Pain: none  Post-op Assessment: Post-op Vital signs reviewed, Patient's Cardiovascular Status Stable and Respiratory Function Stable  Post-op Vital Signs: Reviewed  Filed Vitals:   03/07/14 1415  BP: 130/72  Pulse: 72  Temp:   Resp: 14    Complications: No apparent anesthesia complications

## 2014-03-08 ENCOUNTER — Encounter (HOSPITAL_BASED_OUTPATIENT_CLINIC_OR_DEPARTMENT_OTHER): Payer: Self-pay | Admitting: Orthopedic Surgery

## 2014-05-22 ENCOUNTER — Encounter: Payer: Self-pay | Admitting: Cardiology

## 2014-05-26 ENCOUNTER — Ambulatory Visit (INDEPENDENT_AMBULATORY_CARE_PROVIDER_SITE_OTHER): Payer: BC Managed Care – PPO | Admitting: Cardiology

## 2014-05-26 ENCOUNTER — Encounter: Payer: Self-pay | Admitting: General Surgery

## 2014-05-26 VITALS — BP 126/82 | HR 64 | Ht 70.5 in | Wt 229.8 lb

## 2014-05-26 DIAGNOSIS — E669 Obesity, unspecified: Secondary | ICD-10-CM

## 2014-05-26 DIAGNOSIS — I480 Paroxysmal atrial fibrillation: Secondary | ICD-10-CM

## 2014-05-26 DIAGNOSIS — G4733 Obstructive sleep apnea (adult) (pediatric): Secondary | ICD-10-CM

## 2014-05-26 DIAGNOSIS — I4891 Unspecified atrial fibrillation: Secondary | ICD-10-CM

## 2014-05-26 MED ORDER — METOPROLOL TARTRATE 25 MG PO TABS: 12.5000 mg | ORAL_TABLET | Freq: Two times a day (BID) | ORAL | Status: DC

## 2014-05-26 NOTE — Progress Notes (Signed)
[address] [place], [place]  [address] Phone: [phone] Fax:  [phone]  Date:  05/26/2014   ID:  Larry Mccormick, DOB 12/23/1960, MRN [id number]  PCP:  Mickie Hillier, MD  Cardiologist:  Armanda Magic, MD     History of Present Illness: Larry Mccormick is a 53 y.o. male with a history of OSA on CPAP, obesity and PAF. He is doing well. He denies any chest pain, SOB, DOE, LE edema, dizziness, palpitations or syncope. He tolerates his CPAP well. He tolerates the nasal mask and feels the pressure is adequate. He has no daytime sleepiness but has not felt as rested when he gets up in the am as he had in the past. He goes to bed at 12:30 am or 1am and gets up around 6-6:30am.  He says that he has been snoring some and stopped wearing the chin strap.    Wt Readings from Last 3 Encounters:  05/26/14 229 lb 12.8 oz (104.237 kg)  03/07/14 225 lb (102.059 kg)  03/07/14 225 lb (102.059 kg)     Past Medical History  Diagnosis Date  . Seasonal allergies   . Chest pain   . Anxiety   . OSA (obstructive sleep apnea)     PSG AHI 31.71/hr now on CPAP at 9cm H2O  . Hyperlipidemia   . Vertigo   . Sleep apnea   . Obesity (BMI 30-39.9)   . Atrial fibrillation     08/2011 [facility]- transient, following with Dr. Mayford Knife  . Paroxysmal atrial fibrillation   . Diastolic dysfunction   . Hypertension   . Dysrhythmia     hx PAF-2012  . GERD (gastroesophageal reflux disease)     occ pepcid    Current Outpatient Prescriptions  Medication Sig Dispense Refill  . DiphenhydrAMINE HCl (BENADRYL ALLERGY PO) Take by mouth as needed.        . famotidine (PEPCID) 20 MG tablet Take 20 mg by mouth as needed for heartburn or indigestion.      Marland Kitchen GLUCOSAMINE-CHONDROITIN PO Take 1 capsule by mouth 2 (two) times daily.      . meloxicam (MOBIC) 15 MG tablet Take 15 mg by mouth as needed.       . metoprolol tartrate (LOPRESSOR) 25 MG tablet Take 0.5 tablets (12.5 mg total) by  mouth 2 (two) times daily.  90 tablet  3  . nitroGLYCERIN (NITROSTAT) 0.4 MG SL tablet Place 1 tablet (0.4 mg total) under the tongue every 5 (five) minutes as needed for chest pain.  25 tablet  11   No current facility-administered medications for this visit.    Allergies:    Allergies  Allergen Reactions  . Succinylcholine Chloride Anaphylaxis    After cath 2012-felt very weak and bp dropped  . Vicodin [Hydrocodone-Acetaminophen] Nausea And Vomiting    Pt does not think this is a allergy for him.     Social History:  The patient  reports that he has never smoked. He does not have any smokeless tobacco history on file. He reports that he drinks alcohol. He reports that he does not use illicit drugs.   Family History:  The patient's family history includes Coronary artery disease in an other family member; Diabetes type II in his maternal grandfather; Heart attack (age of onset: 83) in his father; Heart disease in his father.   ROS:  Please see the history of present illness.      All other systems reviewed and  negative.   PHYSICAL EXAM: VS:  BP 126/82  Pulse 64  Ht 5' 10.5" (1.791 m)  Wt 229 lb 12.8 oz (104.237 kg)  BMI 32.50 kg/m2 Well nourished, well developed, in no acute distress HEENT: normal Neck: no JVD Cardiac:  normal S1, S2; RRR; no murmur Lungs:  clear to auscultation bilaterally, no wheezing, rhonchi or rales Abd: soft, nontender, no hepatomegaly Ext: no edema Skin: warm and dry Neuro:  CNs 2-12 intact, no focal abnormalities noted  ASSESSMENT AND PLAN:  1. OSA on CPAP - his d/l today showed an AHI of 1.3/hr and 95% compliance in using more than 4 hours nightly - I encouraged him to use his chin strap and hopefully his snoring will resolve - if it doesn't I ask him to let me know  2. PAF with no reoccurence - continue Metoprolol  3. Obesity  - His exercise is limited by chronic knee pain  Followup with me in 6 months    Signed, Armanda Magic, MD 05/26/2014  11:20 AM

## 2014-05-26 NOTE — Patient Instructions (Signed)
Your physician recommends that you continue on your current medications as directed. Please refer to the Current Medication list given to you today.  Your physician wants you to follow-up in: 6 months with Dr Turner You will receive a reminder letter in the mail two months in advance. If you don't receive a letter, please call our office to schedule the follow-up appointment.  

## 2014-08-11 ENCOUNTER — Encounter: Payer: Self-pay | Admitting: Physician Assistant

## 2014-12-09 ENCOUNTER — Ambulatory Visit (INDEPENDENT_AMBULATORY_CARE_PROVIDER_SITE_OTHER): Payer: BLUE CROSS/BLUE SHIELD | Admitting: Cardiology

## 2014-12-09 ENCOUNTER — Encounter: Payer: Self-pay | Admitting: Cardiology

## 2014-12-09 ENCOUNTER — Telehealth: Payer: Self-pay

## 2014-12-09 VITALS — BP 110/68 | HR 61 | Ht 70.5 in | Wt 221.2 lb

## 2014-12-09 DIAGNOSIS — E669 Obesity, unspecified: Secondary | ICD-10-CM

## 2014-12-09 DIAGNOSIS — I4891 Unspecified atrial fibrillation: Secondary | ICD-10-CM

## 2014-12-09 DIAGNOSIS — I480 Paroxysmal atrial fibrillation: Secondary | ICD-10-CM

## 2014-12-09 DIAGNOSIS — G4733 Obstructive sleep apnea (adult) (pediatric): Secondary | ICD-10-CM

## 2014-12-09 NOTE — Patient Instructions (Signed)
Your physician recommends that you continue on your current medications as directed. Please refer to the Current Medication list given to you today.   Your physician has recommended that you wear an event monitor. DX:  A-FIB Event monitors are medical devices that record the heart's electrical activity. Doctors most often us these monitors to diagnose arrhythmias. Arrhythmias are problems with the speed or rhythm of the heartbeat. The monitor is a small, portable device. You can wear one while you do your normal daily activities. This is usually used to diagnose what is causing palpitations/syncope (passing out).   Your physician recommends that you schedule a follow-up appointment in: 4 WEEK F/U AFTER MONITOR IS OFF

## 2014-12-09 NOTE — Telephone Encounter (Signed)
Informed patient that Dr. Mayford Knifeurner recommends an ECHO done to assess LA size. Patient agrees with treatment plan and ECHO ordered for scheduling.

## 2014-12-09 NOTE — Telephone Encounter (Signed)
Per Dr. Mayford Knifeurner, order ECHO to be done.

## 2014-12-09 NOTE — Progress Notes (Signed)
Cardiology Office Note   Date:  12/09/2014   ID:  Larry Mccormick, DOB 03/07/1961, MRN [id number]  PCP:  Mickie HillierLITTLE,KEVIN LORNE, MD  Cardiologist:    Larry ReichertURNER,Larry Freese R, MD   Chief Complaint  Patient presents with  . Sleep Apnea  . Atrial Fibrillation  . Obesity      History of Present Illness: Larry Mccormick is a 54 y.o. male with a history of OSA on CPAP, obesity and PAF. He is doing well. He denies any chest pain, SOB, DOE, LE edema, dizziness or syncope. He has had some breakthrough of palpitations and was found to be in afib in his PCPs office a while back and was placed on Xarelto.  He tolerates his CPAP well. He tolerates the nasal mask and feels the pressure is adequate. He has no daytime sleepiness and feels rested when he gets up in the am. He doesn't think he has been.   Past Medical History  Diagnosis Date  . Seasonal allergies   . Chest pain   . Anxiety   . OSA (obstructive sleep apnea)     PSG AHI 31.71/hr now on CPAP at 9cm H2O  . Hyperlipidemia   . Vertigo   . Sleep apnea   . Obesity (BMI 30-39.9)   . Atrial fibrillation     [facility], following with Dr. Mayford Knifeurner  . Paroxysmal atrial fibrillation   . Diastolic dysfunction   . Hypertension   . Dysrhythmia     hx PAF-2012  . GERD (gastroesophageal reflux disease)     occ pepcid    Past Surgical History  Procedure Laterality Date  . Chest tube insertion  2012    right post a fall-no problems post  . Colonoscopy      no problems post  . Cardiac catheterization  2012    nonobstructive ASCAD   . Knee arthroscopy Left 03/07/2014    Procedure: LEFT ARTHROSCOPY KNEE;  Surgeon: Nestor LewandowskyFrank J Rowan, MD;  Location: [facility];  Service: Orthopedics;  Laterality: Left;     Current Outpatient Prescriptions  Medication Sig Dispense Refill  . DiphenhydrAMINE HCl (BENADRYL ALLERGY PO) Take by mouth as needed.      . famotidine (PEPCID) 20 MG tablet Take 20 mg by mouth as  needed for heartburn or indigestion.    Marland Kitchen. GLUCOSAMINE-CHONDROITIN PO Take 1 capsule by mouth 2 (two) times daily.    . meloxicam (MOBIC) 15 MG tablet Take 15 mg by mouth as needed.     . metoprolol succinate (TOPROL-XL) 50 MG 24 hr tablet Take 50 mg by mouth daily.  0  . metoprolol tartrate (LOPRESSOR) 25 MG tablet Take 0.5 tablets (12.5 mg total) by mouth 2 (two) times daily. 90 tablet 3  . nitroGLYCERIN (NITROSTAT) 0.4 MG SL tablet Place 1 tablet (0.4 mg total) under the tongue every 5 (five) minutes as needed for chest pain. 25 tablet 11  . XARELTO 20 MG TABS tablet Take 20 mg by mouth daily.  0   No current facility-administered medications for this visit.    Allergies:   Succinylcholine chloride    Social History:  The patient  reports that he has never smoked. He does not have any smokeless tobacco history on file. He reports that he drinks alcohol. He reports that he does not use illicit drugs.   Family History:  The patient's family history includes Coronary artery disease in an other family member; Diabetes type II in his maternal  grandfather; Heart attack (age of onset: 65) in his father; Heart disease in his father.    ROS:  Please see the history of present illness.   Otherwise, review of systems are positive for none.   All other systems are reviewed and negative.    PHYSICAL EXAM: VS:  BP 110/68 mmHg  Pulse 61  Ht 5' 10.5" (1.791 m)  Wt 221 lb 3.2 oz (100.336 kg)  BMI 31.28 kg/m2  SpO2 97% , BMI Body mass index is 31.28 kg/(m^2). GEN: Well nourished, well developed, in no acute distress HEENT: normal Neck: no JVD, carotid bruits, or masses Cardiac: RRR; no murmurs, rubs, or gallops,no edema  Respiratory:  clear to auscultation bilaterally, normal work of breathing GI: soft, nontender, nondistended, + BS MS: no deformity or atrophy Skin: warm and dry, no rash Neuro:  Strength and sensation are intact Psych: euthymic mood, full affect   EKG:  EKG was ordered today  and showed sinus bradycardia at 55bpm with IRBBB and nonspecific T wave abnormaltiy    Recent Labs: 03/03/2014: BUN 15; Creatinine 1.00; Potassium 4.2; Sodium 140 03/07/2014: Hemoglobin 15.4    Lipid Panel No results found for: CHOL, TRIG, HDL, CHOLHDL, VLDL, LDLCALC, LDLDIRECT    Wt Readings from Last 3 Encounters:  12/09/14 221 lb 3.2 oz (100.336 kg)  05/26/14 229 lb 12.8 oz (104.237 kg)  03/07/14 225 lb (102.059 kg)    ASSESSMENT AND PLAN:  1. OSA on CPAP - his d/l today showed an AHI of 1.4/hr and 94% compliance in using more than 4 hours nightly on 9cm H2O 2. PAF with recent episode of PAF.  BB just increased for afib and patient now back in NSR.  He says that he has been having a lot of episodes in the past 2 months.  I will get an event monitor to assess how much PAF he is having.  For the time being I have recommended that he continue on Xarelto.  He has HTN and vascular disease (very minimal CAD) so borderline CHADS2VASC score.  He does work with a lot of farming equipment so he is concerned about possible bleeding risks if he cuts himself.  He agrees to continue for now.  If he continues to have PAF on heart monitor I will refer to Dr. Johney Frame for possible ablation.  I will check an echo to assess LA size. - continue Metoprolol /Xarelto 3. Obesity - His exercise is limited by chronic knee pain   Current medicines are reviewed at length with the patient today.  The patient does not have concerns regarding medicines.  The following changes have been made:  no change  Labs/ tests ordered today include: 2D echo No orders of the defined types were placed in this encounter.     Disposition:   FU with me in 6 months   Signed, Larry Reichert, MD  12/09/2014 1:52 PM    [facility] [address] [place], [place], [place]  [address] Phone: [phone]; Fax: [phone]

## 2014-12-13 ENCOUNTER — Encounter (INDEPENDENT_AMBULATORY_CARE_PROVIDER_SITE_OTHER): Payer: BLUE CROSS/BLUE SHIELD

## 2014-12-13 ENCOUNTER — Ambulatory Visit (HOSPITAL_COMMUNITY): Payer: BLUE CROSS/BLUE SHIELD | Attending: Cardiology | Admitting: Radiology

## 2014-12-13 ENCOUNTER — Encounter: Payer: Self-pay | Admitting: Radiology

## 2014-12-13 DIAGNOSIS — I4891 Unspecified atrial fibrillation: Secondary | ICD-10-CM | POA: Diagnosis present

## 2014-12-13 DIAGNOSIS — I480 Paroxysmal atrial fibrillation: Secondary | ICD-10-CM | POA: Diagnosis not present

## 2014-12-13 DIAGNOSIS — E669 Obesity, unspecified: Secondary | ICD-10-CM

## 2014-12-13 DIAGNOSIS — G4733 Obstructive sleep apnea (adult) (pediatric): Secondary | ICD-10-CM

## 2014-12-13 NOTE — Progress Notes (Signed)
Echocardiogram performed.  

## 2014-12-13 NOTE — Progress Notes (Signed)
Patient ID: Larry Mccormick, male   DOB: [birth year], 54 y.o.   MRN: [id number] [employer] 30 day monitor applied. EOS 01-12-15

## 2014-12-15 ENCOUNTER — Telehealth: Payer: Self-pay | Admitting: Cardiology

## 2014-12-15 NOTE — Telephone Encounter (Signed)
Continue current dose of metoprolol.   

## 2014-12-15 NOTE — Telephone Encounter (Signed)
Informed patient of results and verbal understanding expressed.  Patient agrees to EP referral for consideration of A-fib ablation.  [facility]   Patient st his metoprolol was increased by Dr. Modesto CharonWong when he was at the office and was in a-fib.  He is currently taking 25 mg BID instead of 12.5 mg.   To Dr. Mayford Knifeurner for review.

## 2014-12-15 NOTE — Telephone Encounter (Signed)
Heart monitor shows frequent episodes of PAF.  Please refer to EP for consideration of afib ablation. He does not want to be on Xarelto long term.  He has a history of mild CAD so would avoid flecainide.  His HR sometimes gets as low as 36bpm on the monitor on current BB so cannot titrate BB futher and would avoid amio.

## 2014-12-16 NOTE — Telephone Encounter (Signed)
Instructed patient to continue current dose of metoprolol. Patient agrees with treatment plan and med list updated.

## 2014-12-16 NOTE — Addendum Note (Signed)
Addended by: Gunnar FusiKEMP, Angle Karel A on: 12/16/2014 08:57 AM   Modules accepted: Orders, Medications

## 2014-12-22 ENCOUNTER — Encounter: Payer: Self-pay | Admitting: Cardiology

## 2014-12-27 ENCOUNTER — Encounter: Payer: Self-pay | Admitting: Cardiology

## 2015-01-04 ENCOUNTER — Institutional Professional Consult (permissible substitution): Payer: Self-pay | Admitting: Internal Medicine

## 2015-01-05 ENCOUNTER — Ambulatory Visit (INDEPENDENT_AMBULATORY_CARE_PROVIDER_SITE_OTHER): Payer: BLUE CROSS/BLUE SHIELD | Admitting: Internal Medicine

## 2015-01-05 ENCOUNTER — Encounter: Payer: Self-pay | Admitting: Internal Medicine

## 2015-01-05 VITALS — BP 100/62 | HR 59 | Ht 70.75 in | Wt 213.1 lb

## 2015-01-05 DIAGNOSIS — G4733 Obstructive sleep apnea (adult) (pediatric): Secondary | ICD-10-CM | POA: Diagnosis not present

## 2015-01-05 DIAGNOSIS — I480 Paroxysmal atrial fibrillation: Secondary | ICD-10-CM | POA: Diagnosis not present

## 2015-01-05 MED ORDER — FLECAINIDE ACETATE 50 MG PO TABS: 50.0000 mg | ORAL_TABLET | Freq: Two times a day (BID) | ORAL | Status: DC

## 2015-01-05 MED ORDER — METOPROLOL SUCCINATE ER 25 MG PO TB24
12.5000 mg | ORAL_TABLET | Freq: Two times a day (BID) | ORAL | Status: DC
Start: 2015-01-05 — End: 2016-01-05

## 2015-01-05 NOTE — Patient Instructions (Signed)
Your physician recommends that you schedule a follow-up appointment as needed with Dr Johney FrameAllred  Your physician has recommended you make the following change in your medication:  1) Decrease Metoprolol to 12.5 mg twice daily 2) Start Flecainide 50 mg twice daily

## 2015-01-05 NOTE — Progress Notes (Signed)
Electrophysiology Office Note   Date:  01/05/2015   ID:  Coralie CarpenJonathan T Kantor, DOB [birth year], MRN [id number]  PCP:  Redmond BasemanWONG,FRANCIS PATRICK, MD  Cardiologist:  Dr Mayford Knifeurner Primary Electrophysiologist: Hillis RangeJames Akeisha Lagerquist, MD    Chief Complaint  Patient presents with  . Atrial Fibrillation    PAF     History of Present Illness: Coralie CarpenJonathan T Dimaggio is a 54 y.o. male who presents today for electrophysiology evaluation.   He reports that he initially presented with atrial fibrillation in 2012 after presenting with tachypalpitations.  He has since had increasing frequency and duration of atrial fibrillation.  He has never required cardioversion.  He has been recently wearing an event monitor which documents both afib and atrial flutter.  He has been placed on xarelto which he is tolerating ok.  He also on metoprol which was recently increased.  He has noticed increased fatigue since this was increased.     Today, he denies symptoms of chest pain, shortness of breath, orthopnea, PND, lower extremity edema, claudication, dizziness, presyncope, syncope, bleeding, or neurologic sequela. The patient is tolerating medications without difficulties and is otherwise without complaint today.    Past Medical History  Diagnosis Date  . Seasonal allergies   . Chest pain   . Anxiety   . OSA (obstructive sleep apnea)     PSG AHI 31.71/hr now on CPAP at 9cm H2O  . Hyperlipidemia   . Vertigo   . Obesity (BMI 30-39.9)   . Paroxysmal atrial fibrillation     [facility], following with Dr. Mayford Knifeurner  . Typical atrial flutter   . Diastolic dysfunction   . Hypertension     pt denies  . GERD (gastroesophageal reflux disease)     occ pepcid   Past Surgical History  Procedure Laterality Date  . Chest tube insertion  2012    right post a fall-no problems post  . Colonoscopy      no problems post  . Cardiac catheterization  2012    nonobstructive ASCAD   . Knee arthroscopy Left  03/07/2014    Procedure: LEFT ARTHROSCOPY KNEE;  Surgeon: Nestor LewandowskyFrank J Rowan, MD;  Location: [facility];  Service: Orthopedics;  Laterality: Left;     Current Outpatient Prescriptions  Medication Sig Dispense Refill  . DiphenhydrAMINE HCl (BENADRYL ALLERGY PO) Take 1 tablet by mouth daily as needed (allergies).     . famotidine (PEPCID) 20 MG tablet Take 20 mg by mouth daily as needed for heartburn or indigestion.     Marland Kitchen. GLUCOSAMINE-CHONDROITIN PO Take 1 capsule by mouth 2 (two) times daily.    . meloxicam (MOBIC) 15 MG tablet Take 15 mg by mouth daily as needed for pain.     . metoprolol succinate (TOPROL-XL) 50 MG 24 hr tablet Take 25 mg by mouth 2 (two) times daily.   0  . XARELTO 20 MG TABS tablet Take 20 mg by mouth daily.  0  . nitroGLYCERIN (NITROSTAT) 0.4 MG SL tablet Place 1 tablet (0.4 mg total) under the tongue every 5 (five) minutes as needed for chest pain. (Patient not taking: Reported on 01/05/2015) 25 tablet 11   No current facility-administered medications for this visit.    Allergies:   Succinylcholine chloride   Social History:  The patient  reports that he has never smoked. He does not have any smokeless tobacco history on file. He reports that he does not drink alcohol or use illicit drugs.   Family History:  The patient's  family history includes Coronary artery disease in an other family member; Diabetes type II in his maternal grandfather; Heart attack (age of onset: 57) in his father; Heart disease in his father.    ROS:  Please see the history of present illness.   All other systems are reviewed and negative.    PHYSICAL EXAM: VS:  BP 100/62 mmHg  Pulse 59  Ht 5' 10.75" (1.797 m)  Wt 213 lb 1.9 oz (96.671 kg)  BMI 29.94 kg/m2 , BMI Body mass index is 29.94 kg/(m^2). GEN: Well nourished, well developed, in no acute distress HEENT: normal Neck: no JVD, carotid bruits, or masses Cardiac: RRR; no murmurs, rubs, or gallops,no edema  Respiratory:  clear  to auscultation bilaterally, normal work of breathing GI: soft, nontender, nondistended, + BS MS: no deformity or atrophy Skin: warm and dry  Neuro:  Strength and sensation are intact Psych: euthymic mood, full affect  EKG:  EKG 3/16 is reviewed and reveals sinus rhythm 55 bpm, PR 208 msec, incomplete RBBB   Recent Labs: 03/03/2014: BUN 15; Creatinine 1.00; Potassium 4.2; Sodium 140 03/07/2014: Hemoglobin 15.4    Lipid Panel  No results found for: CHOL, TRIG, HDL, CHOLHDL, VLDL, LDLCALC, LDLDIRECT   Wt Readings from Last 3 Encounters:  01/05/15 213 lb 1.9 oz (96.671 kg)  12/09/14 221 lb 3.2 oz (100.336 kg)  05/26/14 229 lb 12.8 oz (104.237 kg)      Other studies Reviewed: Additional studies/ records that were reviewed today include: event monitor is reviewed, Dr Malachy Mood notes are reviewed    ASSESSMENT AND PLAN:  1.  Paroxysmal atrial fibrillation and atrial flutter The patient has symptomatic recurrent atrial fibrillation and atrial flutter.  Echo 12/13/14 reveals preserved EF with LA size 46 mm. Therapeutic strategies for afib including medicine and ablation were discussed in detail with the patient today. He would prefer to first try medical therapy. Given fatigue and sinus bradycardia, I have reduced metoprolol to 12.5mg  BID.  Add flecainide  BID.  He has follow-up with Dr Mayford Knife 5/16.  I would advise repeat EKG then to evaluate PR interval/ AV conduction.  Would benefit from GXT once on a stable flecainide regimen. chads2vasc score is 1.  Continue xarelto  2. OSA Compliance with CPAP is encouraged  3. HTN Stable No change required today  Current medicines are reviewed at length with the patient today.   The patient does not have concerns regarding his medicines.  The following changes were made today:  none  Follow-up with Dr Mayford Knife as scheduled.  If he fails medical therapy with flecainide then I am happy to see him again to discuss ablation at that  time.   Randolm Idol, MD  01/05/2015 10:56 AM     [facility] [address] [place] [address] [phone] (office) [phone] (fax)

## 2015-01-12 ENCOUNTER — Telehealth: Payer: Self-pay

## 2015-01-12 DIAGNOSIS — I4891 Unspecified atrial fibrillation: Secondary | ICD-10-CM

## 2015-01-12 NOTE — Addendum Note (Signed)
Addended by: Gunnar FusiKEMP, Ronnald Shedden A on: 01/12/2015 12:35 PM   Modules accepted: Orders

## 2015-01-12 NOTE — Telephone Encounter (Signed)
-----   Message from Quintella Reichertraci R Turner, MD sent at 01/09/2015  9:18 PM EDT ----- Please set patient up for ETT in 2 weeks since starting flecainide  Armanda Magicraci Turner ----- Message -----    From: Hillis RangeJames Allred, MD    Sent: 01/09/2015   9:10 PM      To: Quintella Reichertraci R Turner, MD

## 2015-01-12 NOTE — Telephone Encounter (Signed)
Informed patient he will need a GXT done in a couple weeks. Went over instructions with patient.  Instructed him not to eat or drink or use nicotine 4 hours prior to the test. Instructed him to come dressed prepared to exercise. Instructed him to HOLD his metoprolol the AM of his test. Patient agrees with treatment plan.  GXT ordered for scheduling.

## 2015-01-19 ENCOUNTER — Telehealth: Payer: Self-pay | Admitting: Cardiology

## 2015-01-19 NOTE — Telephone Encounter (Signed)
Please let patient know that heart monitor showed frequent episodes of atrial fibrillation with RVR and atrial flutter with RVR, PAC's, sinus bradycardia as low as 39bpm and as high as 182bpm.  Seen by Dr. Johney FrameAllred on 01/05/2015 and added Flecainide 50mg  BID.  He has an ETT scheduled for 4/29.  He continued to have PAF on heart monitor several days after starting flecainide.  Please have patient come in for EKG tomorrow and if ok then will increase Flecainide to 100mg  BID and proceed with ETT on 4/29 as scheduled.

## 2015-01-20 ENCOUNTER — Ambulatory Visit (INDEPENDENT_AMBULATORY_CARE_PROVIDER_SITE_OTHER): Payer: BLUE CROSS/BLUE SHIELD | Admitting: *Deleted

## 2015-01-20 DIAGNOSIS — I480 Paroxysmal atrial fibrillation: Secondary | ICD-10-CM

## 2015-01-20 NOTE — Patient Instructions (Signed)
Medication Instructions:  Increase flecainide to 100mg  two times a day.  Labwork: None today.  Testing/Procedures: None today  Follow-Up: Keep treadmill appointment scheduled for Friday 01/27/15 at 10AM.

## 2015-01-20 NOTE — Progress Notes (Signed)
1.) Reason for visit: EKG  2.) Name of MD requesting visit: Dr Mayford Knifeurner         3.)H&P:  Quintella Reichertraci R Turner, MD at 01/19/2015 10:11 PM     Status: Signed        Please let patient know that heart monitor showed frequent episodes of atrial fibrillation with RVR and atrial flutter with RVR, PAC's, sinus bradycardia as low as 39bpm and as high as 182bpm. Seen by Dr. Johney FrameAllred on 01/05/2015 and added Flecainide 50mg  BID. He has an ETT scheduled for 4/29. He continued to have PAF on heart monitor several days after starting flecainide. Please have patient come in for EKG tomorrow and if ok then will increase Flecainide to 100mg  BID and proceed with ETT on 4/29 as scheduled.         3.) ROS related to problem: pt without complaints  4.) Assessment and plan per MD:             EKG done and reviewed with Dr Ladona Ridgelaylor.           Per Dr Nikki Domaylor--increase flecainide to 100mg  bid, no other changes recommended, keep treadmill appt scheduled for 01/27/15

## 2015-01-20 NOTE — Telephone Encounter (Signed)
Pt advised,verbalized understanding. 

## 2015-01-27 ENCOUNTER — Ambulatory Visit (HOSPITAL_COMMUNITY)
Admission: RE | Admit: 2015-01-27 | Discharge: 2015-01-27 | Disposition: A | Discharge status: Home / Self Care | Payer: BLUE CROSS/BLUE SHIELD | Source: Ambulatory Visit | Attending: Cardiology | Admitting: Cardiology

## 2015-01-27 ENCOUNTER — Other Ambulatory Visit: Payer: Self-pay

## 2015-01-27 ENCOUNTER — Telehealth (HOSPITAL_COMMUNITY): Payer: Self-pay | Admitting: Unknown Physician Specialty

## 2015-01-27 DIAGNOSIS — I4891 Unspecified atrial fibrillation: Secondary | ICD-10-CM

## 2015-01-27 DIAGNOSIS — R0789 Other chest pain: Secondary | ICD-10-CM | POA: Diagnosis present

## 2015-02-02 ENCOUNTER — Other Ambulatory Visit: Payer: Self-pay

## 2015-02-02 MED ORDER — XARELTO 20 MG PO TABS: 20.0000 mg | ORAL_TABLET | Freq: Every day | ORAL | Status: DC

## 2015-02-09 ENCOUNTER — Encounter: Payer: Self-pay | Admitting: Cardiology

## 2015-02-09 ENCOUNTER — Other Ambulatory Visit: Payer: Self-pay

## 2015-02-09 ENCOUNTER — Ambulatory Visit (INDEPENDENT_AMBULATORY_CARE_PROVIDER_SITE_OTHER): Payer: BLUE CROSS/BLUE SHIELD | Admitting: Cardiology

## 2015-02-09 VITALS — BP 102/74 | HR 52 | Ht 70.75 in | Wt 215.8 lb

## 2015-02-09 DIAGNOSIS — G4733 Obstructive sleep apnea (adult) (pediatric): Secondary | ICD-10-CM

## 2015-02-09 DIAGNOSIS — I4891 Unspecified atrial fibrillation: Secondary | ICD-10-CM

## 2015-02-09 DIAGNOSIS — E669 Obesity, unspecified: Secondary | ICD-10-CM | POA: Diagnosis not present

## 2015-02-09 MED ORDER — XARELTO 20 MG PO TABS: 20.0000 mg | ORAL_TABLET | Freq: Every day | ORAL | Status: DC

## 2015-02-09 MED ORDER — ASPIRIN EC 81 MG PO TBEC: 81.0000 mg | DELAYED_RELEASE_TABLET | Freq: Every day | ORAL | Status: AC

## 2015-02-09 MED ORDER — FLECAINIDE ACETATE 50 MG PO TABS: 50.0000 mg | ORAL_TABLET | Freq: Two times a day (BID) | ORAL | Status: DC

## 2015-02-09 NOTE — Progress Notes (Signed)
Cardiology Office Note   Date:  02/09/2015   ID:  Larry CarpenJonathan T Gumbs, DOB 1961/06/05, MRN [id number]  PCP:  Redmond BasemanWONG,FRANCIS PATRICK, MD    Chief Complaint  Patient presents with  . Atrial Fibrillation  . Sleep Apnea      History of Present Illness: Larry Mccormick is a 54 y.o. male with a history of OSA on CPAP, obesity and PAF. He is doing well. He denies any chest pain (except for an episode of heartburn after eating spicy food), SOB, DOE, LE edema, dizziness or syncope. He has had some breakthrough of palpitations and was found to be in afib in his PCPs office a while back and was placed on Xarelto and BB was increased and he converted back to NSR.  He continued to have episodes of PAF and was seen by Dr. Johney FrameAllred and Flecainide was added.  He underwent ETT a week later which was normal.  He continued to have palpitations and flecainide was increased to 100mg  BID. He now presents back for followup.  He is doing well and has not noticed any further atrial fibrillation.    Past Medical History  Diagnosis Date  . Seasonal allergies   . Chest pain   . Anxiety   . OSA (obstructive sleep apnea)     PSG AHI 31.71/hr now on CPAP at 9cm H2O  . Hyperlipidemia   . Vertigo   . Obesity (BMI 30-39.9)   . Paroxysmal atrial fibrillation     08/[facility], following with Dr. Mayford Knifeurner  . Typical atrial flutter   . Diastolic dysfunction   . Hypertension     pt denies  . GERD (gastroesophageal reflux disease)     occ pepcid  . False positive stress test     cath 2012 with non obstructive CAD    Past Surgical History  Procedure Laterality Date  . Chest tube insertion  2012    right post a fall-no problems post  . Colonoscopy      no problems post  . Cardiac catheterization  2012    nonobstructive ASCAD   . Knee arthroscopy Left 03/07/2014    Procedure: LEFT ARTHROSCOPY KNEE;  Surgeon: Nestor LewandowskyFrank J Rowan, MD;  Location: [facility];  Service:  Orthopedics;  Laterality: Left;     Current Outpatient Prescriptions  Medication Sig Dispense Refill  . DiphenhydrAMINE HCl (BENADRYL ALLERGY PO) Take 1 tablet by mouth daily as needed (allergies).     . famotidine (PEPCID) 20 MG tablet Take 20 mg by mouth daily as needed for heartburn or indigestion.     . flecainide (TAMBOCOR) 50 MG tablet Take 100 mg by mouth 2 (two) times daily.    . meloxicam (MOBIC) 15 MG tablet Take 15 mg by mouth daily as needed for pain.     . metoprolol succinate (TOPROL-XL) 25 MG 24 hr tablet Take 0.5 tablets (12.5 mg total) by mouth 2 (two) times daily. 90 tablet 3  . nitroGLYCERIN (NITROSTAT) 0.4 MG SL tablet Place 1 tablet (0.4 mg total) under the tongue every 5 (five) minutes as needed for chest pain. 25 tablet 11  . Omega-3 Fatty Acids (OMEGA-3 FISH OIL PO) Take 2 capsules by mouth 2 (two) times daily. Pt unsure of strength    . Red Yeast Rice Extract (RED YEAST RICE PO) Take 1 capsule by mouth 2 (two) times daily.    Marland Kitchen. GLUCOSAMINE-CHONDROITIN PO Take 1 capsule by mouth 2 (two) times daily.    .Marland Kitchen  XARELTO 20 MG TABS tablet Take 1 tablet (20 mg total) by mouth daily. (Patient not taking: Reported on 02/09/2015) 90 tablet 1   No current facility-administered medications for this visit.    Allergies:   Succinylcholine chloride    Social History:  The patient  reports that he has never smoked. He does not have any smokeless tobacco history on file. He reports that he does not drink alcohol or use illicit drugs.   Family History:  The patient's family history includes Coronary artery disease in an other family member; Diabetes type II in his maternal grandfather; Heart attack (age of onset: 169) in his father; Heart disease in his father.    ROS:  Please see the history of present illness.   Otherwise, review of systems are positive for none.   All other systems are reviewed and negative.    PHYSICAL EXAM: VS:  BP 102/74 mmHg  Pulse 52  Ht 5' 10.75" (1.797 m)   Wt 215 lb 12.8 oz (97.886 kg)  BMI 30.31 kg/m2  SpO2 97% , BMI Body mass index is 30.31 kg/(m^2). GEN: Well nourished, well developed, in no acute distress HEENT: normal Neck: no JVD, carotid bruits, or masses Cardiac: RRR; no murmurs, rubs, or gallops,no edema  Respiratory:  clear to auscultation bilaterally, normal work of breathing GI: soft, nontender, nondistended, + BS MS: no deformity or atrophy Skin: warm and dry, no rash Neuro:  Strength and sensation are intact Psych: euthymic mood, full affect   EKG:  EKG was ordered today showing sinus bradycardia at 52bpm with first degree AV block and rSR' in V1 and V2 - no change from 02/2014    Recent Labs: 03/03/2014: BUN 15; Creatinine 1.00; Potassium 4.2; Sodium 140 03/07/2014: Hemoglobin 15.4    Lipid Panel No results found for: CHOL, TRIG, HDL, CHOLHDL, VLDL, LDLCALC, LDLDIRECT    Wt Readings from Last 3 Encounters:  02/09/15 215 lb 12.8 oz (97.886 kg)  01/05/15 213 lb 1.9 oz (96.671 kg)  12/09/14 221 lb 3.2 oz (100.336 kg)    ASSESSMENT AND PLAN:  1. OSA on CPAP - continue current settings.  D/L today showed an AHI of 1.9 per hour on 9cm H2O and 96% compliance in using more than 4 hours nightly. 2. PAF with recent episode of PAF. Now maintaining NSR on Flecainide 100mg  BID.  ETT showed no exercise induced arrhythmias or ischemia.  He would like to try going back down on the flecainide to 50mg  BID which I told him was fine. If he starts having recurrent palpitations he will go back up to 100mg  BID.  Also that patient states that he does not have HTN and has never been diagnosed with it.  I will remove it as a diagnosis in his PMH.  His CHADS2VASC is essentially 0 ( he only had a 20% narrowing on cath).   - continue Metoprolol /Xarelto/Flecainide 3. Obesity - His exercise is limited by chronic knee pain    Current medicines are reviewed at length with the patient today.  The patient does not have concerns regarding  medicines.  The following changes have been made: no further Xarelto.  Decrease flecainide to 50mg  BID  Labs/ tests ordered today include: see above assessment and plan No orders of the defined types were placed in this encounter.     Disposition:   FU with me in 6 months   Signed, Quintella ReichertURNER,TRACI R, MD  02/09/2015 8:22 AM    [facility]  [address], [place], [place]  [address] Phone: [phone]; Fax: [phone]    

## 2015-02-09 NOTE — Patient Instructions (Addendum)
Medication Instructions:  Your physician has recommended you make the following change in your medication:  1) DECREASE FLECAINIDE to 50 mg TWICE DAILY 2) STOP XARELTO 3) START ASPIRIN 81 mg DAILY  Labwork: None  Testing/Procedures: None  Follow-Up: Your physician wants you to follow-up in: 6 months with Dr. Mayford Knifeurner. You will receive a reminder letter in the mail two months in advance. If you don't receive a letter, please call our office to schedule the follow-up appointment.   Any Other Special Instructions Will Be Listed Below (If Applicable).

## 2015-03-07 ENCOUNTER — Encounter: Payer: Self-pay | Admitting: Cardiology

## 2015-08-10 ENCOUNTER — Ambulatory Visit: Payer: Self-pay | Admitting: Cardiology

## 2015-08-10 ENCOUNTER — Ambulatory Visit (INDEPENDENT_AMBULATORY_CARE_PROVIDER_SITE_OTHER): Payer: BLUE CROSS/BLUE SHIELD | Admitting: Cardiology

## 2015-08-10 VITALS — BP 122/80 | HR 68 | Ht 70.0 in | Wt 232.8 lb

## 2015-08-10 DIAGNOSIS — G4733 Obstructive sleep apnea (adult) (pediatric): Secondary | ICD-10-CM | POA: Diagnosis not present

## 2015-08-10 DIAGNOSIS — E669 Obesity, unspecified: Secondary | ICD-10-CM

## 2015-08-10 DIAGNOSIS — I480 Paroxysmal atrial fibrillation: Secondary | ICD-10-CM

## 2015-08-10 NOTE — Progress Notes (Signed)
Cardiology Office Note   Date:  08/10/2015   ID:  Larry Mccormick, DOB 08-Apr-1961, MRN [id number]  PCP:  Redmond BasemanWONG,FRANCIS PATRICK, MD    Chief Complaint  Patient presents with  . Sleep Apnea  . Atrial Fibrillation      History of Present Illness: Larry Mccormick is a 54 y.o. male with a history of OSA on CPAP, obesity and PAF. He is doing well. He denies any chest pain, SOB, DOE, LE edema, dizziness or syncope. He has had some breakthrough of palpitations only with increased caffeine use. He tolerates his CPAP well. He tolerates the nasal mask and feels the pressure is adequate. He was told that recently he has been snoring.  He has gained 15 pounds since I saw him.  He has no daytime sleepiness and feels rested when he gets up in the am for the most part.    Past Medical History  Diagnosis Date  . Seasonal allergies   . Chest pain   . Anxiety   . OSA (obstructive sleep apnea)     PSG AHI 31.71/hr now on CPAP at 9cm H2O  . Hyperlipidemia   . Vertigo   . Obesity (BMI 30-39.9)   . Paroxysmal atrial fibrillation     [facility], following with Dr. Mayford Knifeurner  . Typical atrial flutter   . Diastolic dysfunction   . GERD (gastroesophageal reflux disease)     occ pepcid  . False positive stress test     cath 2012 with non obstructive CAD    Past Surgical History  Procedure Laterality Date  . Chest tube insertion  2012    right post a fall-no problems post  . Colonoscopy      no problems post  . Cardiac catheterization  2012    nonobstructive ASCAD   . Knee arthroscopy Left 03/07/2014    Procedure: LEFT ARTHROSCOPY KNEE;  Surgeon: Nestor LewandowskyFrank J Rowan, MD;  Location: [facility];  Service: Orthopedics;  Laterality: Left;     Current Outpatient Prescriptions  Medication Sig Dispense Refill  . aspirin EC 81 MG tablet Take 1 tablet (81 mg total) by mouth daily. 90 tablet 3  . DiphenhydrAMINE HCl (BENADRYL ALLERGY PO)  Take 1 tablet by mouth daily as needed (allergies).     . famotidine (PEPCID) 20 MG tablet Take 20 mg by mouth daily as needed for heartburn or indigestion.     . flecainide (TAMBOCOR) 50 MG tablet Take 1 tablet (50 mg total) by mouth 2 (two) times daily.    Marland Kitchen. GLUCOSAMINE-CHONDROITIN PO Take 1 capsule by mouth 2 (two) times daily.    . meloxicam (MOBIC) 15 MG tablet Take 15 mg by mouth daily as needed for pain.     . metoprolol succinate (TOPROL-XL) 25 MG 24 hr tablet Take 0.5 tablets (12.5 mg total) by mouth 2 (two) times daily. 90 tablet 3  . Omega-3 Fatty Acids (OMEGA-3 FISH OIL PO) Take 2 capsules by mouth 2 (two) times daily. Pt unsure of strength    . Red Yeast Rice Extract (RED YEAST RICE PO) Take 1 capsule by mouth 2 (two) times daily.    . nitroGLYCERIN (NITROSTAT) 0.4 MG SL tablet Place 1 tablet (0.4 mg total) under the tongue every 5 (five) minutes as needed for chest pain. 25 tablet 11   No current facility-administered medications for this visit.  Allergies:   Succinylcholine chloride    Social History:  The patient  reports that he has never smoked. He does not have any smokeless tobacco history on file. He reports that he does not drink alcohol or use illicit drugs.   Family History:  The patient's family history includes Coronary artery disease in an other family member; Diabetes type II in his maternal grandfather; Heart attack (age of onset: 74) in his father; Heart disease in his father.    ROS:  Please see the history of present illness.   Otherwise, review of systems are positive for none.   All other systems are reviewed and negative.    PHYSICAL EXAM: VS:  BP 122/80 mmHg  Pulse 68  Ht  (1.778 m)  Wt 232 lb 12.8 oz (105.597 kg)  BMI 33.40 kg/m2 , BMI Body mass index is 33.4 kg/(m^2). GEN: Well nourished, well developed, in no acute distress HEENT: normal Neck: no JVD, carotid bruits, or masses Cardiac: RRR; no murmurs, rubs, or gallops,no edema    Respiratory:  clear to auscultation bilaterally, normal work of breathing GI: soft, nontender, nondistended, + BS MS: no deformity or atrophy Skin: warm and dry, no rash Neuro:  Strength and sensation are intact Psych: euthymic mood, full affect   EKG:  EKG is not ordered today.    Recent Labs: No results found for requested labs within last 365 days.    Lipid Panel No results found for: CHOL, TRIG, HDL, CHOLHDL, VLDL, LDLCALC, LDLDIRECT    Wt Readings from Last 3 Encounters:  08/10/15 232 lb 12.8 oz (105.597 kg)  02/09/15 215 lb 12.8 oz (97.886 kg)  01/05/15 213 lb 1.9 oz (96.671 kg)    ASSESSMENT AND PLAN:  1.  OSA on CPAP - get d/l from DME.  He is snoring now which may be due to recent weight gain.  I will check his d/l to see if his pressure needs to be adjusted.  I have also recommended that he use the chin strap.   2.  PAF maintaining NSR. CHADS VASC score is 1 so no NOAC at this time.   - continue Metoprolol /flecainide - check NOAC 3.  Obesity - weight is up today - His exercise is limited by chronic knee pain.  I have encouraged him to cut back on his portions and sweets and find exercise that does not affect his knees such as swimming.      Current medicines are reviewed at length with the patient today.  The patient does not have concerns regarding medicines.  The following changes have been made:  no change  Labs/ tests ordered today: See above Assessment and Plan No orders of the defined types were placed in this encounter.     Disposition:   FU with me in 6 months  Signed, Quintella Reichert, MD  08/10/2015 9:23 AM    [facility] [address] [place], [place], [place]  [address] Phone: [phone]; Fax: [phone]

## 2015-08-10 NOTE — Patient Instructions (Signed)

## 2015-08-14 ENCOUNTER — Encounter: Payer: Self-pay | Admitting: Cardiology

## 2016-01-05 ENCOUNTER — Other Ambulatory Visit: Payer: Self-pay | Admitting: Internal Medicine

## 2016-02-12 ENCOUNTER — Ambulatory Visit: Payer: Self-pay | Admitting: Cardiology

## 2016-03-07 ENCOUNTER — Ambulatory Visit (INDEPENDENT_AMBULATORY_CARE_PROVIDER_SITE_OTHER): Payer: BLUE CROSS/BLUE SHIELD | Admitting: Cardiology

## 2016-03-07 ENCOUNTER — Encounter: Payer: Self-pay | Admitting: Cardiology

## 2016-03-07 VITALS — BP 122/68 | HR 60 | Ht 71.0 in | Wt 229.0 lb

## 2016-03-07 DIAGNOSIS — E669 Obesity, unspecified: Secondary | ICD-10-CM

## 2016-03-07 DIAGNOSIS — I480 Paroxysmal atrial fibrillation: Secondary | ICD-10-CM | POA: Diagnosis not present

## 2016-03-07 DIAGNOSIS — R5382 Chronic fatigue, unspecified: Secondary | ICD-10-CM

## 2016-03-07 DIAGNOSIS — R5383 Other fatigue: Secondary | ICD-10-CM

## 2016-03-07 DIAGNOSIS — G4733 Obstructive sleep apnea (adult) (pediatric): Secondary | ICD-10-CM

## 2016-03-07 HISTORY — DX: Other fatigue: R53.83

## 2016-03-07 MED ORDER — DILTIAZEM HCL ER COATED BEADS 120 MG PO CP24: 120.0000 mg | ORAL_CAPSULE | Freq: Every day | ORAL | Status: DC

## 2016-03-07 NOTE — Patient Instructions (Signed)
Medication Instructions:  1) STOP METOPROLOL 2) STOP RED YEAST RICE 3) START CARDIZEM CD 120 mg daily  Labwork: None  Testing/Procedures: Dr. Mayford Knifeurner recommends you have a NUCLEAR STRESS TEST.  Follow-Up: Your physician wants you to follow-up in: 6 months with Dr. Mayford Knifeurner. You will receive a reminder letter in the mail two months in advance. If you don't receive a letter, please call our office to schedule the follow-up appointment.   Any Other Special Instructions Will Be Listed Below (If Applicable).     If you need a refill on your cardiac medications before your next appointment, please call your pharmacy.

## 2016-03-07 NOTE — Progress Notes (Signed)
Cardiology Office Note    Date:  03/07/2016   ID:  Larry Mccormick, DOB [birth year], MRN [id number]  PCP:  Redmond Baseman, MD  Cardiologist:  Armanda Magic, MD   Chief Complaint  Patient presents with  . Atrial Fibrillation  . Sleep Apnea    History of Present Illness:  Larry Mccormick is a 55 y.o. male with a history of OSA on CPAP, obesity and PAF. He is doing well. He denies any chest pain, SOB, DOE, LE edema, palpitations, dizziness or syncope. He tolerates his CPAP well. He tolerates the nasal mask and feels the pressure is adequate.  He complains of feeling more tired during the day.  He goes to bed at MN -1am and gets up at 7:00am.He thinks the fatigue may be due to his BB.  He has gained 15lbs since last year.   Past Medical History  Diagnosis Date  . Seasonal allergies   . Anxiety   . OSA (obstructive sleep apnea)     PSG AHI 31.71/hr now on CPAP at 9cm H2O  . Hyperlipidemia   . Vertigo   . Obesity (BMI 30-39.9)   . Paroxysmal atrial fibrillation [facility])     08/2011 [facility]- transient, following with Dr. Mayford Knife  . Typical atrial flutter (HCC)   . Diastolic dysfunction   . GERD (gastroesophageal reflux disease)     occ pepcid  . False positive stress test     cath 2012 with non obstructive CAD  . Fatigue 03/07/2016    Past Surgical History  Procedure Laterality Date  . Chest tube insertion  2012    right post a fall-no problems post  . Colonoscopy      no problems post  . Cardiac catheterization  2012    nonobstructive ASCAD   . Knee arthroscopy Left 03/07/2014    Procedure: LEFT ARTHROSCOPY KNEE;  Surgeon: Nestor Lewandowsky, MD;  Location: [facility];  Service: Orthopedics;  Laterality: Left;    Current Medications: Outpatient Prescriptions Prior to Visit  Medication Sig Dispense Refill  . aspirin EC 81 MG tablet Take 1 tablet (81 mg total) by mouth daily. 90 tablet 3  . DiphenhydrAMINE HCl (BENADRYL ALLERGY PO) Take 1  tablet by mouth daily as needed (allergies).     . famotidine (PEPCID) 20 MG tablet Take 20 mg by mouth daily as needed for heartburn or indigestion.     . flecainide (TAMBOCOR) 50 MG tablet Take 1 tablet (50 mg total) by mouth 2 (two) times daily.    . meloxicam (MOBIC) 15 MG tablet Take 15 mg by mouth daily as needed for pain.     . metoprolol succinate (TOPROL-XL) 25 MG 24 hr tablet TAKE ONE-HALF TABLET BY MOUTH TWICE DAILY 90 tablet 0  . Omega-3 Fatty Acids (OMEGA-3 FISH OIL PO) Take 2 capsules by mouth 2 (two) times daily. Pt unsure of strength    . Red Yeast Rice Extract (RED YEAST RICE PO) Take 1 capsule by mouth 2 (two) times daily.    . nitroGLYCERIN (NITROSTAT) 0.4 MG SL tablet Place 1 tablet (0.4 mg total) under the tongue every 5 (five) minutes as needed for chest pain. 25 tablet 11  . flecainide (TAMBOCOR) 50 MG tablet TAKE ONE TABLET BY MOUTH TWICE DAILY (Patient not taking: Reported on 03/07/2016) 180 tablet 0  . GLUCOSAMINE-CHONDROITIN PO Take 1 capsule by mouth 2 (two) times daily. Reported on 03/07/2016     No facility-administered medications prior to visit.  Allergies:   Succinylcholine chloride   Social History   Social History  . Marital Status: Single    Spouse Name: N/A  . Number of Children: N/A  . Years of Education: N/A   Social History Main Topics  . Smoking status: Never Smoker   . Smokeless tobacco: Not on file  . Alcohol Use: No     Comment: occ wine  . Drug Use: No  . Sexual Activity: Not on file   Other Topics Concern  . Not on file   Social History Narrative   [place] lives in [place].  Owns several farms.     Family History:  The patient's family history includes Diabetes type II in his maternal grandfather; Heart attack (age of onset: 6269) in his father; Heart disease in his father.   ROS:   Please see the history of present illness.    ROS All other systems reviewed and are negative.   PHYSICAL EXAM:   VS:  BP 122/68 mmHg  Pulse  60  Ht 5\' 11"  (1.803 m)  Wt 229 lb (103.874 kg)  BMI 31.95 kg/m2   GEN: Well nourished, well developed, in no acute distress HEENT: normal Neck: no JVD, carotid bruits, or masses Cardiac: RRR; no murmurs, rubs, or gallops,no edema.  Intact distal pulses bilaterally.  Respiratory:  clear to auscultation bilaterally, normal work of breathing GI: soft, nontender, nondistended, + BS MS: no deformity or atrophy Skin: warm and dry, no rash Neuro:  Alert and Oriented x 3, Strength and sensation are intact Psych: euthymic mood, full affect  Wt Readings from Last 3 Encounters:  03/07/16 229 lb (103.874 kg)  08/10/15 232 lb 12.8 oz (105.597 kg)  02/09/15 215 lb 12.8 oz (97.886 kg)      Studies/Labs Reviewed:   EKG:  EKG is not ordered today.    Recent Labs: No results found for requested labs within last 365 days.   Lipid Panel No results found for: CHOL, TRIG, HDL, CHOLHDL, VLDL, LDLCALC, LDLDIRECT  Additional studies/ records that were reviewed today include:  CPAP download    ASSESSMENT:    1. OSA (obstructive sleep apnea)   2. Paroxysmal atrial fibrillation (HCC)   3. Obesity (BMI 30-39.9)   4. Chronic fatigue      PLAN:  In order of problems listed above:  OSA - the patient is tolerating PAP therapy well without any problems. The PAP download was reviewed today and showed an AHI of 1.4/hr on 9 cm H2O with 100% compliance in using more than 4 hours nightly.  The patient has been using and benefiting from CPAP use and will continue to benefit from therapy.  PAF - maintaining sinus bradycardia.  QTc 407msec.  Continue flecainide.  I will change his BB to Cardiazem CD 120mg  daily due to fatigue that may be caused by his BB. Obesity - I have encouraged him to get into a routine exercise program and cut back on carbs and portions.  Fatigue - this may be due to the BB so I will change his BB to Cardizem CA 120mg  daily.  I have encouraged him to try to get 8 hours of sleep  nightly.  I am going to get a stress myoview to rule out ischemia.     Medication Adjustments/Labs and Tests Ordered: Current medicines are reviewed at length with the patient today.  Concerns regarding medicines are outlined above.  Medication changes, Labs and Tests ordered today are listed in the Patient Instructions  below.  There are no Patient Instructions on file for this visit.   Signed, Armanda Magic, MD  03/07/2016 9:06 AM    [facility] [address] [place], [place], [place]  [address] Phone: [phone]; Fax: [phone]

## 2016-03-12 ENCOUNTER — Telehealth (HOSPITAL_COMMUNITY): Payer: Self-pay | Admitting: *Deleted

## 2016-03-12 ENCOUNTER — Encounter: Payer: Self-pay | Admitting: Cardiology

## 2016-03-12 NOTE — Telephone Encounter (Signed)
Patient given detailed instructions per Myocardial Perfusion Study Information Sheet for the test on 03/18/16 at 1000. Patient notified to arrive 15 minutes early and that it is imperative to arrive on time for appointment to keep from having the test rescheduled.  If you need to cancel or reschedule your appointment, please call the office within 24 hours of your appointment. Failure to do so may result in a cancellation of your appointment, and a $50 no show fee. Patient verbalized understanding.Larry Mccormick    

## 2016-03-18 ENCOUNTER — Ambulatory Visit (HOSPITAL_COMMUNITY): Payer: BLUE CROSS/BLUE SHIELD | Attending: Cardiology

## 2016-03-18 DIAGNOSIS — I480 Paroxysmal atrial fibrillation: Secondary | ICD-10-CM | POA: Diagnosis not present

## 2016-03-18 DIAGNOSIS — R5382 Chronic fatigue, unspecified: Secondary | ICD-10-CM

## 2016-03-18 LAB — MYOCARDIAL PERFUSION IMAGING
CHL CUP NUCLEAR SDS: 0
CHL CUP NUCLEAR SRS: 2
CHL CUP NUCLEAR SSS: 2
CSEPEDS: 15 s
Estimated workload: 12.2 METS
Exercise duration (min): 10 min
LV dias vol: 154 mL (ref 62–150)
LVSYSVOL: 58 mL
MPHR: 167 {beats}/min
Peak HR: 153 {beats}/min
Percent HR: 91 %
RATE: 0.43
RPE: 18
Rest HR: 54 {beats}/min
TID: 1.26

## 2016-03-18 MED ORDER — TECHNETIUM TC 99M TETROFOSMIN IV KIT
30.5000 | PACK | Freq: Once | INTRAVENOUS | Status: AC | PRN
  Administered 2016-03-18: 31 via INTRAVENOUS
  Filled 2016-03-18: qty 31

## 2016-03-18 MED ORDER — TECHNETIUM TC 99M TETROFOSMIN IV KIT
10.2000 | PACK | Freq: Once | INTRAVENOUS | Status: AC | PRN
  Administered 2016-03-18: 10 via INTRAVENOUS
  Filled 2016-03-18: qty 10

## 2016-04-12 ENCOUNTER — Other Ambulatory Visit: Payer: Self-pay | Admitting: Cardiology

## 2016-08-01 ENCOUNTER — Telehealth: Payer: Self-pay | Admitting: Cardiology

## 2016-08-01 NOTE — Telephone Encounter (Signed)
Patient is due for December for 35mo follow-up - if there is nothing available he will have to take a January appointment.   He can only see Dr. Mayford Knifeurner for Sleep.

## 2016-08-01 NOTE — Telephone Encounter (Signed)
New message    Pt  Verbalized that he wants an appt with Dr.Turner in December for sleep can the patient be scheduled with a PA for sleep reasons

## 2016-08-02 ENCOUNTER — Encounter (HOSPITAL_COMMUNITY): Payer: Self-pay | Admitting: Emergency Medicine

## 2016-08-02 ENCOUNTER — Emergency Department (HOSPITAL_COMMUNITY): Payer: BLUE CROSS/BLUE SHIELD

## 2016-08-02 ENCOUNTER — Emergency Department (HOSPITAL_COMMUNITY)
Admission: EM | Admit: 2016-08-02 | Discharge: 2016-08-02 | Disposition: A | Discharge status: Home / Self Care | Payer: BLUE CROSS/BLUE SHIELD | Attending: Emergency Medicine | Admitting: Emergency Medicine

## 2016-08-02 DIAGNOSIS — Z7982 Long term (current) use of aspirin: Secondary | ICD-10-CM | POA: Insufficient documentation

## 2016-08-02 DIAGNOSIS — N200 Calculus of kidney: Secondary | ICD-10-CM | POA: Insufficient documentation

## 2016-08-02 DIAGNOSIS — R109 Unspecified abdominal pain: Secondary | ICD-10-CM | POA: Diagnosis present

## 2016-08-02 DIAGNOSIS — Z955 Presence of coronary angioplasty implant and graft: Secondary | ICD-10-CM | POA: Insufficient documentation

## 2016-08-02 LAB — CBC WITH DIFFERENTIAL/PLATELET
Basophils Absolute: 0 10*3/uL (ref 0.0–0.1)
Basophils Relative: 0 %
Eosinophils Absolute: 0 10*3/uL (ref 0.0–0.7)
Eosinophils Relative: 0 %
HEMATOCRIT: 45.5 % (ref 39.0–52.0)
HEMOGLOBIN: 15.9 g/dL (ref 13.0–17.0)
LYMPHS ABS: 1.1 10*3/uL (ref 0.7–4.0)
LYMPHS PCT: 12 %
MCH: 31.4 pg (ref 26.0–34.0)
MCHC: 34.9 g/dL (ref 30.0–36.0)
MCV: 89.7 fL (ref 78.0–100.0)
Monocytes Absolute: 0.4 10*3/uL (ref 0.1–1.0)
Monocytes Relative: 5 %
NEUTROS PCT: 83 %
Neutro Abs: 7.1 10*3/uL (ref 1.7–7.7)
Platelets: 183 10*3/uL (ref 150–400)
RBC: 5.07 MIL/uL (ref 4.22–5.81)
RDW: 12.9 % (ref 11.5–15.5)
WBC: 8.7 10*3/uL (ref 4.0–10.5)

## 2016-08-02 LAB — BASIC METABOLIC PANEL
Anion gap: 8 (ref 5–15)
BUN: 19 mg/dL (ref 6–20)
CHLORIDE: 105 mmol/L (ref 101–111)
CO2: 27 mmol/L (ref 22–32)
CREATININE: 1.22 mg/dL (ref 0.61–1.24)
Calcium: 9.8 mg/dL (ref 8.9–10.3)
GFR calc non Af Amer: >60 mL/min (ref >60)
Glucose, Bld: 136 mg/dL — ABNORMAL HIGH (ref 65–99)
POTASSIUM: 3.5 mmol/L (ref 3.5–5.1)
Sodium: 140 mmol/L (ref 135–145)

## 2016-08-02 LAB — URINALYSIS, ROUTINE W REFLEX MICROSCOPIC
Bilirubin Urine: NEGATIVE
Glucose, UA: NEGATIVE mg/dL
KETONES UR: NEGATIVE mg/dL
LEUKOCYTES UA: NEGATIVE
NITRITE: NEGATIVE
PROTEIN: 30 mg/dL — AB
Specific Gravity, Urine: 1.03 (ref 1.005–1.030)
pH: 6 (ref 5.0–8.0)

## 2016-08-02 LAB — URINE MICROSCOPIC-ADD ON
Bacteria, UA: NONE SEEN
Squamous Epithelial / LPF: NONE SEEN
WBC UA: NONE SEEN WBC/hpf (ref 0–5)

## 2016-08-02 MED ORDER — PROMETHAZINE HCL 25 MG PO TABS: 25.0000 mg | ORAL_TABLET | Freq: Four times a day (QID) | ORAL | 0 refills | Status: DC | PRN

## 2016-08-02 MED ORDER — PROMETHAZINE HCL 25 MG/ML IJ SOLN
6.2500 mg | Freq: Once | INTRAMUSCULAR | Status: AC
  Administered 2016-08-02: 6.25 mg via INTRAVENOUS
  Filled 2016-08-02: qty 1

## 2016-08-02 MED ORDER — TAMSULOSIN HCL 0.4 MG PO CAPS
0.4000 mg | ORAL_CAPSULE | Freq: Once | ORAL | Status: AC
  Administered 2016-08-02: 0.4 mg via ORAL
  Filled 2016-08-02: qty 1

## 2016-08-02 MED ORDER — HYDROCODONE-ACETAMINOPHEN 5-325 MG PO TABS: 1.0000 | ORAL_TABLET | ORAL | 0 refills | Status: DC | PRN

## 2016-08-02 MED ORDER — TAMSULOSIN HCL 0.4 MG PO CAPS: 0.4000 mg | ORAL_CAPSULE | Freq: Once | ORAL | 0 refills | Status: DC

## 2016-08-02 MED ORDER — HYDROMORPHONE HCL 1 MG/ML IJ SOLN
1.0000 mg | Freq: Once | INTRAMUSCULAR | Status: AC
Start: 2016-08-02 — End: 2016-08-02
  Administered 2016-08-02: 1 mg via INTRAVENOUS
  Filled 2016-08-02: qty 1

## 2016-08-02 MED ORDER — ONDANSETRON HCL 4 MG/2ML IJ SOLN
4.0000 mg | Freq: Once | INTRAMUSCULAR | Status: AC
  Administered 2016-08-02: 4 mg via INTRAVENOUS
  Filled 2016-08-02: qty 2

## 2016-08-02 MED ORDER — KETOROLAC TROMETHAMINE 30 MG/ML IJ SOLN
30.0000 mg | Freq: Once | INTRAMUSCULAR | Status: AC
  Administered 2016-08-02: 30 mg via INTRAVENOUS
  Filled 2016-08-02: qty 1

## 2016-08-02 MED ORDER — HYDROMORPHONE HCL 1 MG/ML IJ SOLN
0.5000 mg | Freq: Once | INTRAMUSCULAR | Status: AC
Start: 2016-08-02 — End: 2016-08-02
  Administered 2016-08-02: 0.5 mg via INTRAVENOUS
  Filled 2016-08-02: qty 1

## 2016-08-02 MED ORDER — TAMSULOSIN HCL 0.4 MG PO CAPS: 0.4000 mg | ORAL_CAPSULE | Freq: Once | ORAL | 0 refills | Status: AC

## 2016-08-02 NOTE — ED Triage Notes (Signed)
Pt states that he woke up this morning with rt sided flank pain with NV.  States he is having trouble getting a stream of urine going.

## 2016-08-02 NOTE — ED Notes (Signed)
Flomax held, patient states he is not able to take PO at this time due to ongoing nausea.

## 2016-08-02 NOTE — ED Provider Notes (Signed)
[facility]-EMERGENCY DEPT Provider Note   CSN: [id number] Arrival date & time: 08/02/16  1116     History   Chief Complaint Chief Complaint  Patient presents with  . Flank Pain  . Nausea    HPI Larry Mccormick is a 55 y.o. male.  55 year old Caucasian male with no significant past medical history presents to the ED today with right flank pain. Patient states the pain started prior to arrival. The pain is intermittent and sharp in nature. Nothing makes better worse. Has not tried anything at home for the pain. It is associated with nausea, emesis, urgency, frequency. Denies any dysuria or hematuria. Patient states the pain radiates to his right abdomen. He denies any fever, chills, headache, vision changes, lightheadedness, dizziness, chest pain, shortness of breath, change in bowel habits, numbness/tingling. Patient denies any history of kidney stones.      Past Medical History:  Diagnosis Date  . Anxiety   . Diastolic dysfunction   . False positive stress test    cath 2012 with non obstructive CAD  . Fatigue 03/07/2016  . GERD (gastroesophageal reflux disease)    occ pepcid  . Hyperlipidemia   . Obesity (BMI 30-39.9)   . OSA (obstructive sleep apnea)    PSG AHI 31.71/hr now on CPAP at 9cm H2O  . Paroxysmal atrial fibrillation [facility])    08/2011 [facility]- transient, following with Dr. Mayford Knife  . Seasonal allergies   . Typical atrial flutter (HCC)   . Vertigo     Patient Active Problem List   Diagnosis Date Noted  . Fatigue 03/07/2016  . Obesity (BMI 30-39.9)   . Atrial fibrillation (HCC)   . Anxiety   . OSA (obstructive sleep apnea)   . Hyperlipidemia   . Vertigo   . Abnormal stress electrocardiogram test 02/01/2011    Past Surgical History:  Procedure Laterality Date  . CARDIAC CATHETERIZATION  2012   nonobstructive ASCAD   . CHEST TUBE INSERTION  2012   right post a fall-no problems post  . COLONOSCOPY     no problems post  . KNEE ARTHROSCOPY Left  03/07/2014   Procedure: LEFT ARTHROSCOPY KNEE;  Surgeon: Nestor Lewandowsky, MD;  Location: [facility];  Service: Orthopedics;  Laterality: Left;       Home Medications    Prior to Admission medications   Medication Sig Start Date End Date Taking? Authorizing Provider  aspirin EC 81 MG tablet Take 1 tablet (81 mg total) by mouth daily. 02/09/15  Yes Quintella Reichert, MD  diltiazem (CARDIZEM CD) 120 MG 24 hr capsule Take 1 capsule (120 mg total) by mouth daily. 03/07/16  Yes Quintella Reichert, MD  DiphenhydrAMINE HCl (BENADRYL ALLERGY PO) Take 1 tablet by mouth daily as needed (allergies).    Yes Historical Provider, MD  famotidine (PEPCID) 20 MG tablet Take 20 mg by mouth daily as needed for heartburn or indigestion.    Yes Historical Provider, MD  flecainide (TAMBOCOR) 50 MG tablet Take 1 tablet (50 mg total) by mouth 2 (two) times daily. 02/09/15  Yes Quintella Reichert, MD  Glucos-Chond-Hyal Ac-Ca Fructo (MOVE FREE JOINT HEALTH ADVANCE) TABS Take 1 tablet by mouth daily.   Yes Historical Provider, MD  meloxicam (MOBIC) 15 MG tablet Take 15 mg by mouth at bedtime.    Yes Historical Provider, MD  Omega-3 Fatty Acids (OMEGA-3 FISH OIL PO) Take 1 capsule by mouth daily.    Yes Historical Provider, MD  flecainide (TAMBOCOR) 50  MG tablet TAKE ONE TABLET BY MOUTH TWICE DAILY Patient not taking: Reported on 08/02/2016 04/12/16   Quintella Reichertraci R Turner, MD  nitroGLYCERIN (NITROSTAT) 0.4 MG SL tablet Place 1 tablet (0.4 mg total) under the tongue every 5 (five) minutes as needed for chest pain. Patient not taking: Reported on 08/02/2016 02/01/11 08/02/16  Beatrice LecherScott T Weaver, PA-C    Family History Family History  Problem Relation Age of Onset  . Heart attack Father [id number]  . Heart disease Father   . Diabetes type II Maternal Grandfather   . Coronary artery disease      Social History Social History  Substance Use Topics  . Smoking status: Never Smoker  . Smokeless tobacco: Not on file  . Alcohol use No      Comment: occ wine     Allergies   Succinylcholine chloride   Review of Systems Review of Systems  Constitutional: Negative for chills and fever.  HENT: Negative for congestion, ear pain, rhinorrhea and sore throat.   Eyes: Negative for pain and discharge.  Respiratory: Negative for cough and shortness of breath.   Cardiovascular: Negative for chest pain and palpitations.  Gastrointestinal: Negative for abdominal pain, diarrhea, nausea and vomiting.  Genitourinary: Positive for dysuria, flank pain (right), frequency, hematuria and urgency. Negative for penile pain, scrotal swelling and testicular pain.  Musculoskeletal: Negative for myalgias and neck pain.  Skin: Negative.   Neurological: Negative for dizziness, syncope, weakness, light-headedness, numbness and headaches.  All other systems reviewed and are negative.    Physical Exam Updated Vital Signs BP 126/89   Pulse 62   Temp 97.7 F (36.5 C) (Oral)   Resp 16   SpO2 97%   Physical Exam  Constitutional: He appears well-developed and well-nourished. No distress.  HENT:  Head: Normocephalic and atraumatic.  Mouth/Throat: Oropharynx is clear and moist.  Eyes: Conjunctivae are normal. Right eye exhibits no discharge. Left eye exhibits no discharge. No scleral icterus.  Neck: Normal range of motion. Neck supple. No thyromegaly present.  Cardiovascular: Normal rate, regular rhythm, normal heart sounds and intact distal pulses.   Pulmonary/Chest: Effort normal and breath sounds normal.  Abdominal: Soft. Bowel sounds are normal. He exhibits no distension. There is tenderness in the right lower quadrant. There is CVA tenderness (right). There is no rigidity, no rebound, no guarding and no tenderness at McBurney's point.  Musculoskeletal: Normal range of motion.  Lymphadenopathy:    He has no cervical adenopathy.  Neurological: He is alert.  Skin: Skin is warm and dry. Capillary refill takes less than 2 seconds.  Nursing note  and vitals reviewed.    ED Treatments / Results  Labs (all labs ordered are listed, but only abnormal results are displayed) Labs Reviewed  BASIC METABOLIC PANEL - Abnormal; Notable for the following:       Result Value   Glucose, Bld 136 (*)    All other components within normal limits  URINALYSIS, ROUTINE W REFLEX MICROSCOPIC (NOT AT [facility]) - Abnormal; Notable for the following:    Color, Urine AMBER (*)    APPearance TURBID (*)    Hgb urine dipstick LARGE (*)    Protein, ur 30 (*)    All other components within normal limits  CBC WITH DIFFERENTIAL/PLATELET  URINE MICROSCOPIC-ADD ON    EKG  EKG Interpretation None       Radiology Ct Renal Stone Study  Result Date: 08/02/2016 CLINICAL DATA:  Right-sided abdominal pain for several hours and increased darkness of the  urine EXAM: CT ABDOMEN AND PELVIS WITHOUT CONTRAST TECHNIQUE: Multidetector CT imaging of the abdomen and pelvis was performed following the standard protocol without IV contrast. COMPARISON:  None. FINDINGS: Lower chest: No acute abnormality. Hepatobiliary: Diffuse fatty infiltration of the liver is noted. No focal mass lesion is seen. The gallbladder is within normal limits. Pancreas: Unremarkable. No pancreatic ductal dilatation or surrounding inflammatory changes. Spleen: Normal in size without focal abnormality. Adrenals/Urinary Tract: The adrenal glands are within normal limits. The left kidney is well visualize without evidence renal calculi or obstructive changes. The left ureter is unremarkable. Right kidney demonstrates mild perinephric stranding and mild hydronephrosis and hydroureter. This extends distally to just above the level of the ureterovesical junction. A small 6 mm stone is noted distally causing the obstructive change. The bladder is within normal limits. Stomach/Bowel: The appendix is well visualized and within normal limits. No specific bowel abnormality is noted. Vascular/Lymphatic: Aortic  atherosclerosis. No enlarged abdominal or pelvic lymph nodes. Reproductive: Prostate is unremarkable. Other: No abdominal wall hernia or abnormality. No abdominopelvic ascites. Musculoskeletal: No acute or significant osseous findings. IMPRESSION: 6 mm distal right ureteral stone causing mild obstructive change. Fatty liver. No other focal abnormality is seen. Electronically Signed   By: Alcide CleverMark  Lukens M.D.   On: 08/02/2016 13:05    Procedures Procedures (including critical care time)  Medications Ordered in ED Medications  ondansetron (ZOFRAN) injection 4 mg (4 mg Intravenous Given 08/02/16 1219)  HYDROmorphone (DILAUDID) injection 1 mg (1 mg Intravenous Given 08/02/16 1220)  ondansetron (ZOFRAN) injection 4 mg (4 mg Intravenous Given 08/02/16 1305)  HYDROmorphone (DILAUDID) injection 0.5 mg (0.5 mg Intravenous Given 08/02/16 1319)  ketorolac (TORADOL) 30 MG/ML injection 30 mg (30 mg Intravenous Given 08/02/16 1319)  tamsulosin (FLOMAX) capsule 0.4 mg (0.4 mg Oral Given 08/02/16 1357)  promethazine (PHENERGAN) injection 6.25 mg (6.25 mg Intravenous Given 08/02/16 1701)     Initial Impression / Assessment and Plan / ED Course  I have reviewed the triage vital signs and the nursing notes.  Pertinent labs & imaging results that were available during my care of the patient were reviewed by me and considered in my medical decision making (see chart for details).  Clinical Course  Pt has been diagnosed with a Kidney Stone via CT. There is no evidence of significant hydronephrosis, serum creatine WNL, vitals sign stable and the pt does not have irratractable vomiting. Pain and emesis treated in ED. Spoke with urology and patient ok to follow up outpatient. Pt will be dc home with pain medications, nausea medicine, and flomax & has been advised to follow up with urology. Patient states phenergan works better than zofran due to motion sickness. Hemodynamically stable and discharged home in NAD with stable vs.  Patient verbalized understanding and agreement to the above plan. Strict return precautions given including development of fever or intractable vomiting.   Final Clinical Impressions(s) / ED Diagnoses   Final diagnoses:  Nephrolithiasis    New Prescriptions Discharge Medication List as of 08/02/2016  6:12 PM    START taking these medications   Details  HYDROcodone-acetaminophen (NORCO/VICODIN) 5-325 MG tablet Take 1-2 tablets by mouth every 4 (four) hours as needed., Starting Fri 08/02/2016, Print    promethazine (PHENERGAN) 25 MG tablet Take 1 tablet (25 mg total) by mouth every 6 (six) hours as needed for nausea or vomiting., Starting Fri 08/02/2016, Print    tamsulosin (FLOMAX) 0.4 MG CAPS capsule Take 1 capsule (0.4 mg total) by mouth once., Starting  Fri 08/02/2016, Print         Rise Mu, PA-C 08/03/16 0111    Arby Barrette, MD 08/03/16 1223

## 2016-08-02 NOTE — ED Notes (Signed)
Pt still refusing an in and out catheter. Pt states he will be willing to have one if he is still unable to urinate y 1630

## 2016-08-02 NOTE — Discharge Instructions (Signed)
Your CAT scan showed a right 6 mm kidney stone. Please take the Flomax once a day. He may also take the Phenergan as needed for nausea. He may take the pain medicine as needed and as prescribed. I would continue taking ibuprofen as well. He follow up with the urology referral that I provided. Please return to the ED if your symptoms worsen or if he develops signs of fever.

## 2016-08-02 NOTE — ED Notes (Signed)
Pt verbalized understanding of d/c instructions and follow up care. 

## 2016-08-02 NOTE — ED Notes (Signed)
Pt is aware that a urine sample is needed but is unable to collect one at this time 

## 2016-08-02 NOTE — ED Notes (Signed)
Pt ambulated to bathroom to try to provide sample. Pt reminded that an in and out can be preformed.

## 2016-08-02 NOTE — ED Notes (Signed)
Pt placed on 2L of oxygen for an oxygen saturation of of 85%. Pt reminded of need for a urine sample

## 2016-08-02 NOTE — ED Notes (Signed)
Pt was told he would receive a pill to help him use the restroom.

## 2016-08-02 NOTE — ED Notes (Signed)
Pt was reminded of the need for a urine sample. Pt was told we can use an in and out catheter to get it if we need to/he prefers. Pt stated he would like ten more minutes to try to provide a sample

## 2016-11-05 ENCOUNTER — Ambulatory Visit (INDEPENDENT_AMBULATORY_CARE_PROVIDER_SITE_OTHER): Payer: BLUE CROSS/BLUE SHIELD | Admitting: Physician Assistant

## 2016-11-05 ENCOUNTER — Encounter (INDEPENDENT_AMBULATORY_CARE_PROVIDER_SITE_OTHER): Payer: Self-pay

## 2016-11-05 ENCOUNTER — Encounter: Payer: Self-pay | Admitting: Physician Assistant

## 2016-11-05 VITALS — BP 132/84 | HR 60 | Ht 71.0 in | Wt 229.2 lb

## 2016-11-05 DIAGNOSIS — G4733 Obstructive sleep apnea (adult) (pediatric): Secondary | ICD-10-CM

## 2016-11-05 DIAGNOSIS — R5382 Chronic fatigue, unspecified: Secondary | ICD-10-CM | POA: Diagnosis not present

## 2016-11-05 DIAGNOSIS — I480 Paroxysmal atrial fibrillation: Secondary | ICD-10-CM | POA: Diagnosis not present

## 2016-11-05 DIAGNOSIS — E669 Obesity, unspecified: Secondary | ICD-10-CM | POA: Diagnosis not present

## 2016-11-05 NOTE — Progress Notes (Signed)
Cardiology Office Note    Date:  11/05/2016   ID:  Larry Mccormick, DOB [birth year], MRN [id number]  PCP:  Duane LopeAlan Ross, MD  Cardiologist: Dr. Mayford Knifeurner  Chief Complaint  Patient presents with  . Follow-up    History of Present Illness:  Larry Mccormick is a 56 y.o. male with a history of OSA on CPAP, obesity and PAF on Flecainide.Saw Dr. Mayford Knifeurner 02/2016 and was complaining of fatigue secondary to the beta blocker so was changed to Cardizem 120 mg daily. Nuclear stress test was done and showed no ischemia LVEF 62%.  Patient comes in today for routine follow-up. Since his last office visit he had a kidney stone that was quite painful. They put him on oxygen and he said it really helped. Sometimes when he falls asleep sitting up watching TV he feels short of breath but he always uses his CPAP when he goes to bed. He's had less fatigue on the Cardizem. He denies chest pain, palpitations, dyspnea on exertion, dizziness or presyncope. He is getting close to 8 hours of sleep.     Past Medical History:  Diagnosis Date  . Anxiety   . Diastolic dysfunction   . False positive stress test    cath 2012 with non obstructive CAD  . Fatigue 03/07/2016  . GERD (gastroesophageal reflux disease)    occ pepcid  . Hyperlipidemia   . Obesity (BMI 30-39.9)   . OSA (obstructive sleep apnea)    PSG AHI 31.71/hr now on CPAP at 9cm H2O  . Paroxysmal atrial fibrillation [facility](HCC)    08/2011 [facility], following with Dr. Mayford Knifeurner  . Seasonal allergies   . Typical atrial flutter (HCC)   . Vertigo     Past Surgical History:  Procedure Laterality Date  . CARDIAC CATHETERIZATION  2012   nonobstructive ASCAD   . CHEST TUBE INSERTION  2012   right post a fall-no problems post  . COLONOSCOPY     no problems post  . KNEE ARTHROSCOPY Left 03/07/2014   Procedure: LEFT ARTHROSCOPY KNEE;  Surgeon: Nestor LewandowskyFrank J Rowan, MD;  Location: [facility];  Service: Orthopedics;  Laterality: Left;      Current Medications: Outpatient Medications Prior to Visit  Medication Sig Dispense Refill  . aspirin EC 81 MG tablet Take 1 tablet (81 mg total) by mouth daily. 90 tablet 3  . diltiazem (CARDIZEM CD) 120 MG 24 hr capsule Take 1 capsule (120 mg total) by mouth daily. 90 capsule 3  . DiphenhydrAMINE HCl (BENADRYL ALLERGY PO) Take 1 tablet by mouth daily as needed (allergies).     . famotidine (PEPCID) 20 MG tablet Take 20 mg by mouth daily as needed for heartburn or indigestion.     . flecainide (TAMBOCOR) 50 MG tablet Take 1 tablet (50 mg total) by mouth 2 (two) times daily.    . Glucos-Chond-Hyal Ac-Ca Fructo (MOVE FREE JOINT HEALTH ADVANCE) TABS Take 1 tablet by mouth daily.    . meloxicam (MOBIC) 15 MG tablet Take 15 mg by mouth at bedtime.     . Omega-3 Fatty Acids (OMEGA-3 FISH OIL PO) Take 1 capsule by mouth daily.     . promethazine (PHENERGAN) 25 MG tablet Take 1 tablet (25 mg total) by mouth every 6 (six) hours as needed for nausea or vomiting. 15 tablet 0  . flecainide (TAMBOCOR) 50 MG tablet TAKE ONE TABLET BY MOUTH TWICE DAILY (Patient not taking: Reported on 08/02/2016) 180 tablet 3  . HYDROcodone-acetaminophen (  NORCO/VICODIN) 5-325 MG tablet Take 1-2 tablets by mouth every 4 (four) hours as needed. (Patient not taking: Reported on 11/05/2016) 12 tablet 0  . nitroGLYCERIN (NITROSTAT) 0.4 MG SL tablet Place 1 tablet (0.4 mg total) under the tongue every 5 (five) minutes as needed for chest pain. (Patient not taking: Reported on 08/02/2016) 25 tablet 11   No facility-administered medications prior to visit.      Allergies:   Succinylcholine chloride   Social History   Social History  . Marital status: Single    Spouse name: N/A  . Number of children: N/A  . Years of education: N/A   Social History Main Topics  . Smoking status: Never Smoker  . Smokeless tobacco: Never Used  . Alcohol use No     Comment: occ wine  . Drug use: No  . Sexual activity: Not Asked   Other  Topics Concern  . None   Social History Narrative   Pt lives in [place] alone.  Owns several farms.     Family History:  The patient's family history includes Diabetes type II in his maternal grandfather; Heart attack (age of onset: 15) in his father; Heart disease in his father.   ROS:   Please see the history of present illness.    Review of Systems  Constitution: Positive for malaise/fatigue.  HENT: Negative.   Cardiovascular: Negative.   Respiratory: Positive for sleep disturbances due to breathing.        On CPap  Endocrine: Negative.   Hematologic/Lymphatic: Negative.   Musculoskeletal: Negative.   Gastrointestinal: Negative.   Genitourinary: Negative.   Neurological: Negative.    All other systems reviewed and are negative.   PHYSICAL EXAM:   VS:  BP 132/84   Pulse 60   Ht 5\' 11"  (1.803 m)   Wt 229 lb 3.2 oz (104 kg)   BMI 31.97 kg/m   Physical Exam  GEN: Obese in no acute distress  Neck: no JVD, carotid bruits, or masses Cardiac:RRR; no murmurs, rubs, or gallops  Respiratory:  clear to auscultation bilaterally, normal work of breathing GI: soft, nontender, nondistended, + BS Ext: without cyanosis, clubbing, or edema, Good distal pulses bilaterally Psych: euthymic mood, full affect  Wt Readings from Last 3 Encounters:  11/05/16 229 lb 3.2 oz (104 kg)  03/18/16 229 lb (103.9 kg)  03/07/16 229 lb (103.9 kg)      Studies/Labs Reviewed:   EKG:  EKG is ordered today.  The ekg ordered today demonstrates Normal sinus rhythm with first-degree AV block, T-wave inversion inferior lateral, no acute change  Recent Labs: 08/02/2016: BUN 19; Creatinine, Ser 1.22; Hemoglobin 15.9; Platelets 183; Potassium 3.5; Sodium 140   Lipid Panel No results found for: CHOL, TRIG, HDL, CHOLHDL, VLDL, LDLCALC, LDLDIRECT  Additional studies/ records that were reviewed today include:  Nuclear stress chest 03/18/16 Study Highlights   Nuclear stress EF: 62%.  There was no ST  segment deviation noted during stress.  No T wave inversion was noted during stress.  Defect 1: There is a medium defect of severe severity present in the basal inferior, mid inferior and apical inferior location. Consistent with diaphragm attenuation  The study is normal.  This is a low risk study.  The left ventricular ejection fraction is normal (55-65%).  Excellent exercise capacity.     2-D echo 12/13/14 Study Conclusions  - Left ventricle: The cavity size was normal. Wall thickness was   increased in a pattern of mild LVH. Systolic function  was normal.   The estimated ejection fraction was in the range of 55% to 60%.   Wall motion was normal; there were no regional wall motion   abnormalities. Left ventricular diastolic function parameters   were normal. - Left atrium: The atrium was mildly to moderately dilated. - Atrial septum: No defect or patent foramen ovale was identified.   ASSESSMENT:    1. Paroxysmal atrial fibrillation (HCC)   2. OSA (obstructive sleep apnea)   3. Obesity (BMI 30-39.9)   4. Chronic fatigue      PLAN:  In order of problems listed above:  PAF maintaining normal sinus rhythm on flecainide. Labs checked in November and were stable.  OSA tolerating CPAP well. Dr. Mayford Knife to review numbers.  Obesity weight loss and exercise program recommended  Chronic fatigue has improved some off beta blocker and on diltiazem. Follow-up with Dr. Mayford Knife in 6 months.    Medication Adjustments/Labs and Tests Ordered: Current medicines are reviewed at length with the patient today.  Concerns regarding medicines are outlined above.  Medication changes, Labs and Tests ordered today are listed in the Patient Instructions below. Patient Instructions  Medication Instructions:  Your physician recommends that you continue on your current medications as directed. Please refer to the Current Medication list given to you today.    Labwork: None  Testing/Procedures: None  Follow-Up: Your physician wants you to follow-up in: 6 months with Dr. Mayford Knife. You will receive a reminder letter in the mail two months in advance. If you don't receive a letter, please call our office to schedule the follow-up appointment.   Any Other Special Instructions Will Be Listed Below (If Applicable).     If you need a refill on your cardiac medications before your next appointment, please call your pharmacy.      Elson Clan, PA-C  11/05/2016 9:51 AM    [facility] [address] [place], [place], [place]  [address] Phone: [phone]; Fax: [phone]

## 2016-11-05 NOTE — Patient Instructions (Signed)

## 2016-11-15 ENCOUNTER — Telehealth: Payer: Self-pay | Admitting: *Deleted

## 2016-11-15 NOTE — Telephone Encounter (Signed)
Called the patient with his results, he verbalized understanding and agreed 

## 2016-11-15 NOTE — Telephone Encounter (Signed)
-----   Message from Traci R Turner, MD sent at 11/14/2016  2:35 PM EST ----- Good AHI and compliance.  Continue current CPAP settings. 

## 2017-03-05 ENCOUNTER — Other Ambulatory Visit: Payer: Self-pay | Admitting: Cardiology

## 2017-03-28 IMAGING — CT CT RENAL STONE PROTOCOL
2 of 3 series · 16 of 46 positions shown, 18 images · non-contrast
Comparison: None.

CLINICAL DATA: Right-sided abdominal pain for several hours and
increased darkness of the urine

EXAM:
CT ABDOMEN AND PELVIS WITHOUT CONTRAST
TECHNIQUE: Multidetector CT imaging of the abdomen and pelvis was performed
following the standard protocol without IV contrast.

[Series 3: coronal · coronal · 0.83mm/px · 3 of 143 slices shown]
[im 48/143  soft-tissue]
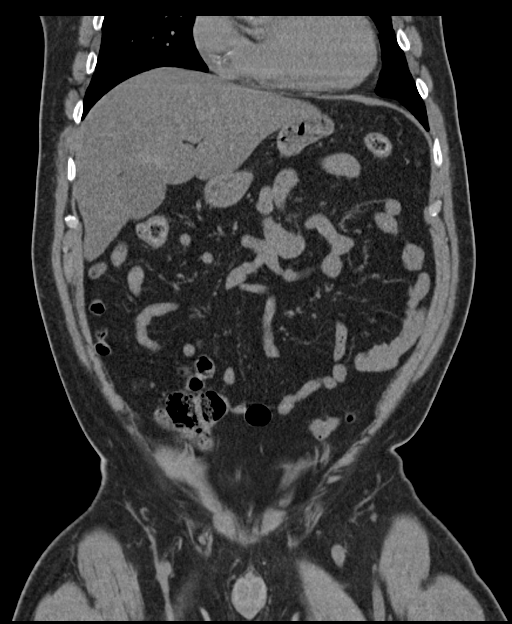
[im 64/143  soft-tissue]
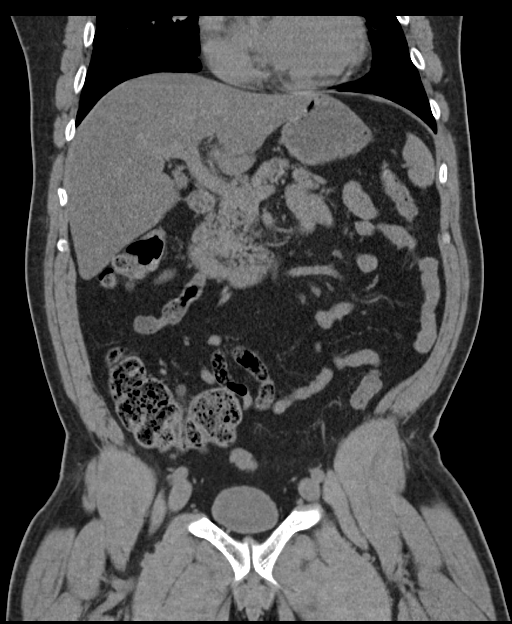
[im 79/143  soft-tissue]
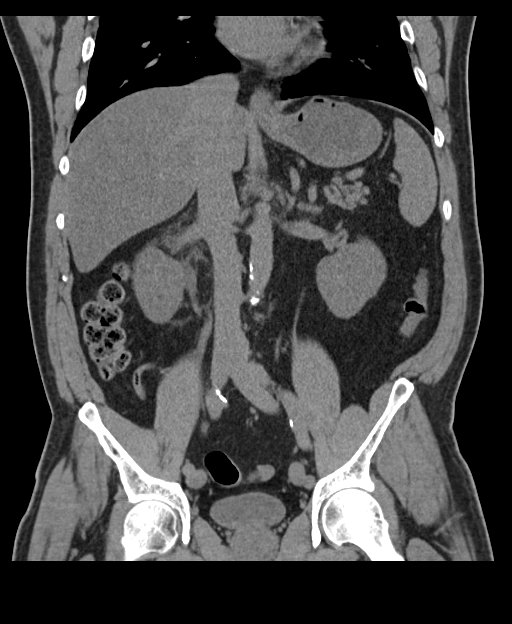

[Series 6: lung · axial · 0.87mm/px · z∈[-399,-249]mm · 13 of 87 slices shown, 15 images]
[im 6/87  soft-tissue]
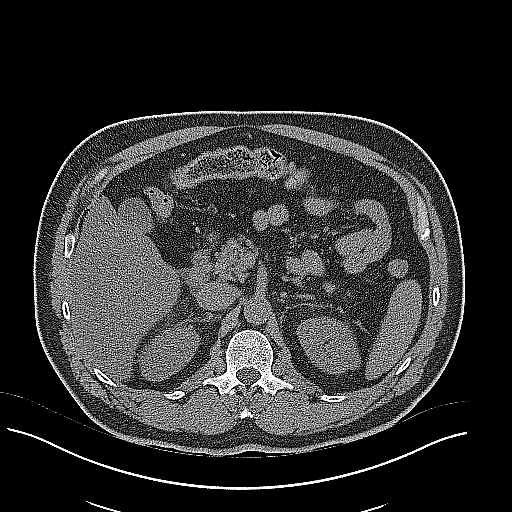
[im 6/87  bone]
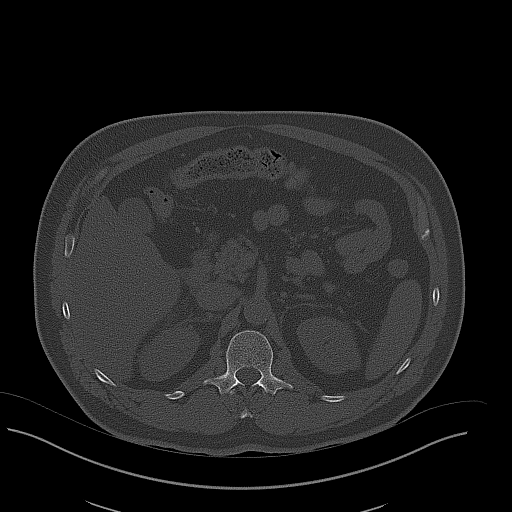
[im 12/87  soft-tissue]
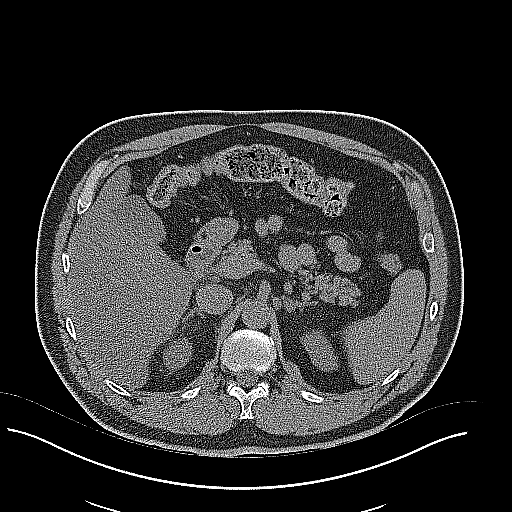
[im 17/87  soft-tissue]
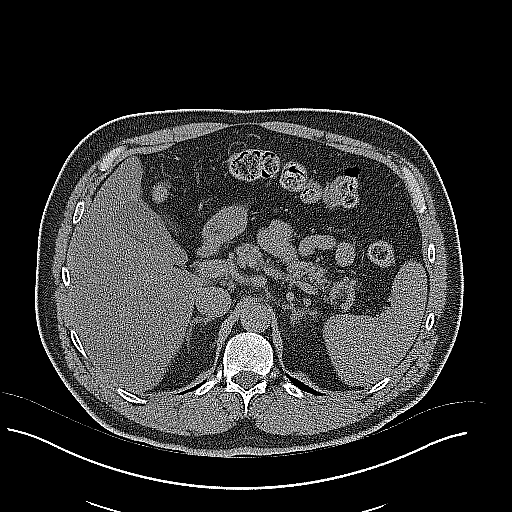
[im 25/87  soft-tissue]
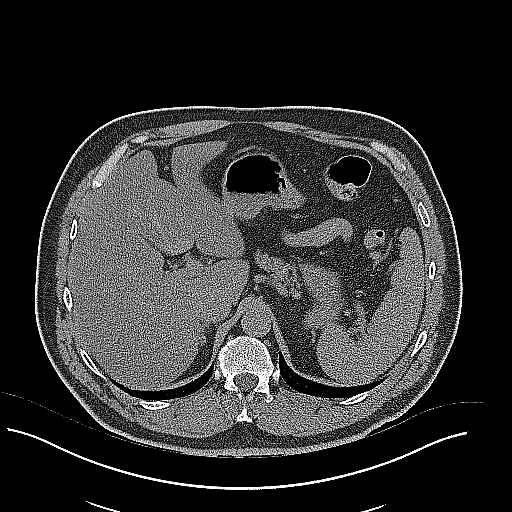
[im 31/87  soft-tissue]
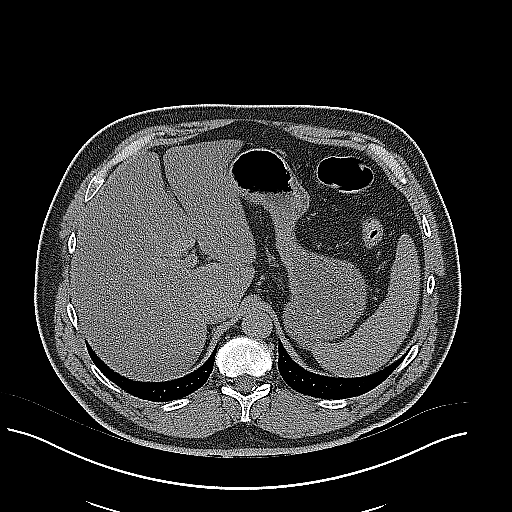
[im 37/87  soft-tissue]
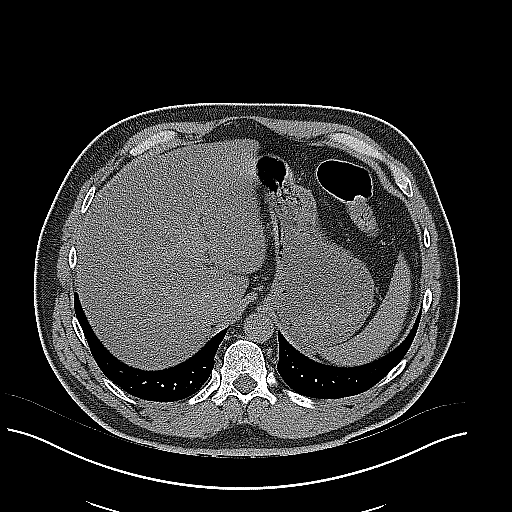
[im 45/87  soft-tissue]
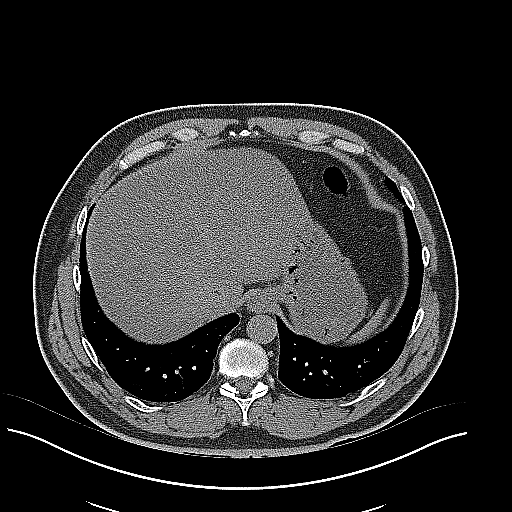
[im 50/87  soft-tissue]
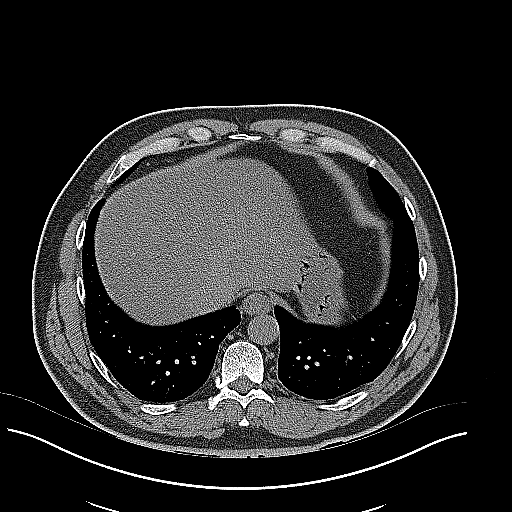
[im 56/87  soft-tissue]
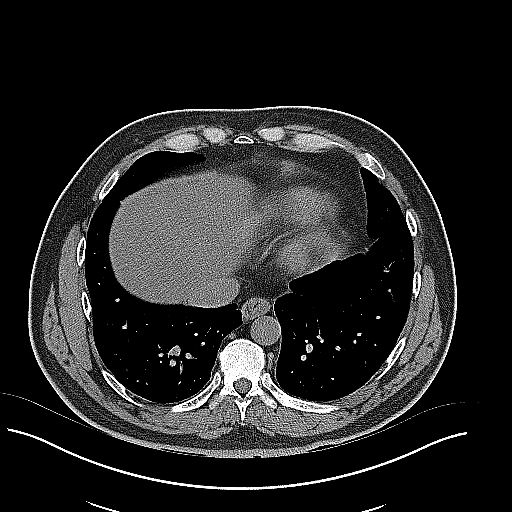
[im 56/87  bone]
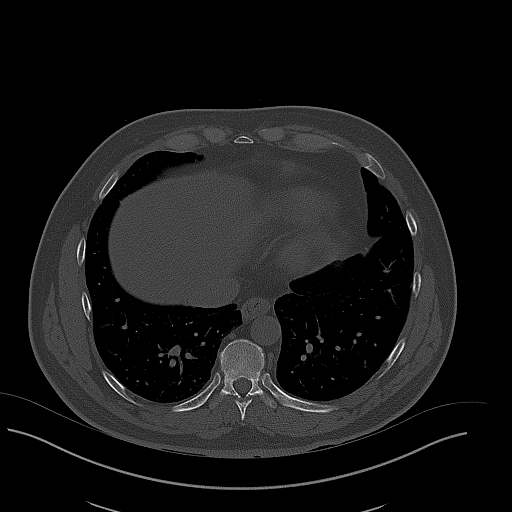
[im 62/87  soft-tissue]
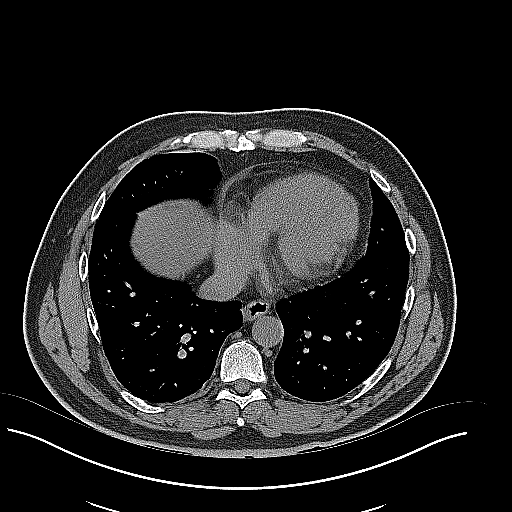
[im 70/87  soft-tissue]
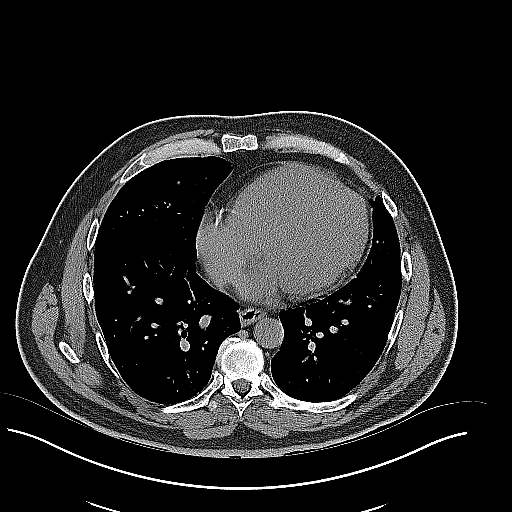
[im 75/87  soft-tissue]
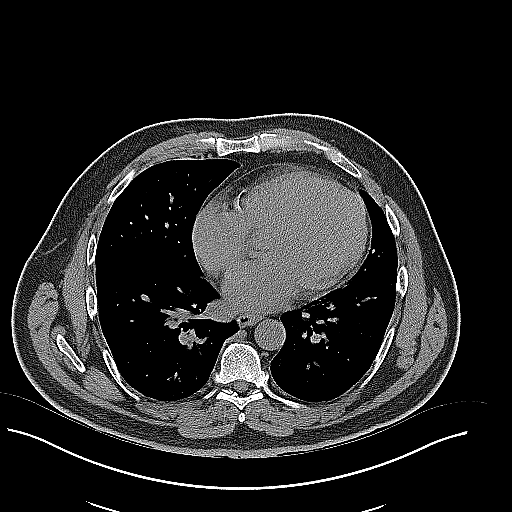
[im 81/87  soft-tissue]
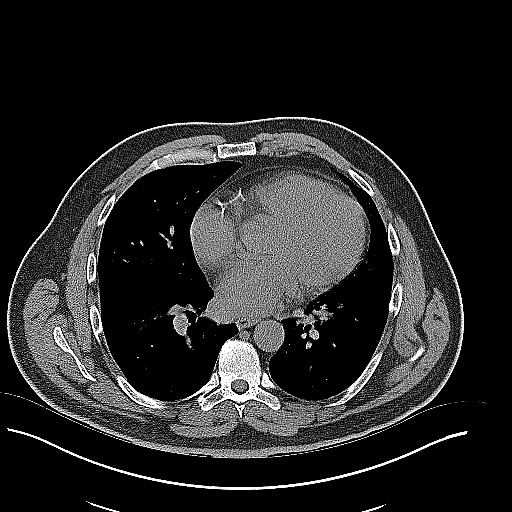

[16 of 46 positions shown; findings below may reference images not displayed]

FINDINGS: Lower chest: No acute abnormality.

Hepatobiliary: Diffuse fatty infiltration of the liver is noted. No
focal mass lesion is seen. The gallbladder is within normal limits.

Pancreas: Unremarkable. No pancreatic ductal dilatation or
surrounding inflammatory changes.

Spleen: Normal in size without focal abnormality.

Adrenals/Urinary Tract: The adrenal glands are within normal limits.
The left kidney is well visualize without evidence renal calculi or
obstructive changes. The left ureter is unremarkable. Right kidney
demonstrates mild perinephric stranding and mild hydronephrosis and
hydroureter. This extends distally to just above the level of the
ureterovesical junction. A small 6 mm stone is noted distally
causing the obstructive change. The bladder is within normal limits.

Stomach/Bowel: The appendix is well visualized and within normal
limits. No specific bowel abnormality is noted.

Vascular/Lymphatic: Aortic atherosclerosis. No enlarged abdominal or
pelvic lymph nodes.

Reproductive: Prostate is unremarkable.

Other: No abdominal wall hernia or abnormality. No abdominopelvic
ascites.

Musculoskeletal: No acute or significant osseous findings.
IMPRESSION: 6 mm distal right ureteral stone causing mild obstructive change.

Fatty liver.

No other focal abnormality is seen.

## 2017-05-11 ENCOUNTER — Other Ambulatory Visit: Payer: Self-pay | Admitting: Cardiology

## 2017-05-12 ENCOUNTER — Encounter: Payer: Self-pay | Admitting: Cardiology

## 2017-05-22 ENCOUNTER — Encounter: Payer: Self-pay | Admitting: Cardiology

## 2017-05-29 ENCOUNTER — Encounter: Payer: Self-pay | Admitting: Cardiology

## 2017-05-29 ENCOUNTER — Ambulatory Visit (INDEPENDENT_AMBULATORY_CARE_PROVIDER_SITE_OTHER): Payer: BLUE CROSS/BLUE SHIELD | Admitting: Cardiology

## 2017-05-29 VITALS — BP 110/62 | HR 78 | Ht 71.0 in | Wt 226.8 lb

## 2017-05-29 DIAGNOSIS — E669 Obesity, unspecified: Secondary | ICD-10-CM

## 2017-05-29 DIAGNOSIS — G4733 Obstructive sleep apnea (adult) (pediatric): Secondary | ICD-10-CM

## 2017-05-29 DIAGNOSIS — I480 Paroxysmal atrial fibrillation: Secondary | ICD-10-CM | POA: Diagnosis not present

## 2017-05-29 NOTE — Progress Notes (Signed)
Cardiology Office Note:    Date:  05/29/2017   ID:  Larry Mccormick, DOB 11-18-1960, MRN [id number]  PCP:  Daisy Floro, MD  Cardiologist:  Armanda Magic, MD   Referring MD: Daisy Floro, MD   Chief Complaint  Patient presents with  . Sleep Apnea  . Atrial Fibrillation    History of Present Illness:    Larry Mccormick is a 56 y.o. male with a hx of OSA on CPAP, obesity and PAF on Flecainide.  Prior nuclear stress test 03/2016 showed no ischemia.  He is here today for followup and is doing well.  He denies any chest pain or pressure, SOB, DOE, PND, orthopnea, LE edema, dizziness, palpitations or syncope.  He tolerates his nasal pillow mask with no chin strap.  He occasionally has some mild snoring and dry mouth and has a chin strap at home and says he needs to use it.  He says that he has started to feel more tired in the morning and during the day but is not napping.  He needs new supplies.  He goes to bed at [facility] and gets up at 6:30am.    Past Medical History:  Diagnosis Date  . Anxiety   . Diastolic dysfunction   . False positive stress test    cath 2012 with non obstructive CAD  . Fatigue 03/07/2016  . GERD (gastroesophageal reflux disease)    occ pepcid  . Hyperlipidemia   . Obesity (BMI 30-39.9)   . OSA (obstructive sleep apnea)    PSG AHI 31.71/hr now on CPAP at 9cm H2O  . Paroxysmal atrial fibrillation [facility])    08/2011 [facility]- transient, following with Dr. Mayford Knife  . Seasonal allergies   . Typical atrial flutter (HCC)   . Vertigo     Past Surgical History:  Procedure Laterality Date  . CARDIAC CATHETERIZATION  2012   nonobstructive ASCAD   . CHEST TUBE INSERTION  2012   right post a fall-no problems post  . COLONOSCOPY     no problems post  . KNEE ARTHROSCOPY Left 03/07/2014   Procedure: LEFT ARTHROSCOPY KNEE;  Surgeon: Nestor Lewandowsky, MD;  Location: [facility];  Service: Orthopedics;  Laterality: Left;    Current  Medications: Current Meds  Medication Sig  . aspirin EC 81 MG tablet Take 1 tablet (81 mg total) by mouth daily.  Marland Kitchen CARTIA XT 120 MG 24 hr capsule TAKE ONE  BY MOUTH ONCE DAILY  . Cholecalciferol (VITAMIN D3) 5000 units CAPS Take 5,000 Units by mouth daily.  . DiphenhydrAMINE HCl (BENADRYL ALLERGY PO) Take 1 tablet by mouth daily as needed (allergies).   . famotidine (PEPCID) 20 MG tablet Take 20 mg by mouth daily as needed for heartburn or indigestion.   . flecainide (TAMBOCOR) 50 MG tablet TAKE ONE TABLET BY MOUTH TWICE DAILY  . Glucos-Chond-Hyal Ac-Ca Fructo (MOVE FREE JOINT HEALTH ADVANCE) TABS Take 1 tablet by mouth daily.  . Glucosamine-MSM-Hyaluronic Acd (JOINT HEALTH PO) Take by mouth daily.  . meloxicam (MOBIC) 15 MG tablet Take 15 mg by mouth at bedtime.   . Omega-3 Fatty Acids (OMEGA-3 FISH OIL PO) Take 1 capsule by mouth daily.   . promethazine (PHENERGAN) 25 MG tablet Take 1 tablet (25 mg total) by mouth every 6 (six) hours as needed for nausea or vomiting.     Allergies:   Succinylcholine chloride   Social History   Social History  . Marital status: Single  Spouse name: N/A  . Number of children: N/A  . Years of education: N/A   Social History Main Topics  . Smoking status: Never Smoker  . Smokeless tobacco: Never Used  . Alcohol use No     Comment: occ wine  . Drug use: No  . Sexual activity: Not Asked   Other Topics Concern  . None   Social History Narrative   Pt lives in [place] alone.  Owns several farms.     Family History: The patient's family history includes Coronary artery disease in his unknown relative; Diabetes type II in his maternal grandfather; Heart attack (age of onset: 5269) in his father; Heart disease in his father.  ROS:   Please see the history of present illness.     All other systems reviewed and are negative.  EKGs/Labs/Other Studies Reviewed:    The following studies were reviewed today: CPAP download  EKG:  EKG is not  ordered today.  Recent Labs: 08/02/2016: BUN 19; Creatinine, Ser 1.22; Hemoglobin 15.9; Platelets 183; Potassium 3.5; Sodium 140   Recent Lipid Panel No results found for: CHOL, TRIG, HDL, CHOLHDL, VLDL, LDLCALC, LDLDIRECT  Physical Exam:    VS:  BP 110/62   Pulse 78   Ht 5\' 11"  (1.803 m)   Wt 226 lb 12.8 oz (102.9 kg)   SpO2 95%   BMI 31.63 kg/m     Wt Readings from Last 3 Encounters:  05/29/17 226 lb 12.8 oz (102.9 kg)  11/05/16 229 lb 3.2 oz (104 kg)  03/18/16 229 lb (103.9 kg)     GEN:  Well nourished, well developed in no acute distress HEENT: Normal NECK: No JVD; No carotid bruits LYMPHATICS: No lymphadenopathy CARDIAC: RRR, no murmurs, rubs, gallops RESPIRATORY:  Clear to auscultation without rales, wheezing or rhonchi  ABDOMEN: Soft, non-tender, non-distended MUSCULOSKELETAL:  No edema; No deformity  SKIN: Warm and dry NEUROLOGIC:  Alert and oriented x 3 PSYCHIATRIC:  Normal affect   ASSESSMENT:    1. OSA (obstructive sleep apnea)   2. PAF (paroxysmal atrial fibrillation) (HCC)   3. Obesity (BMI 30-39.9)    PLAN:    In order of problems listed above:  OSA - the patient is tolerating PAP therapy well without any problems. The PAP download was reviewed today and showed an AHI of 1.8/hr on 9 cm H2O with 100% compliance in using more than 4 hours nightly.  The patient has been using and benefiting from CPAP use and will continue to benefit from therapy. I have recommended that he use the chin strap to help with mouth dryness and snoring.  I will order him new supplies.  I encouraged him to go to be earlier so he can get 8 hours of sleep as sleep deprivation is likely the cause of his daytime fatigue.    2.  PAF - he is maintaining NSR.  His CHADS2VASC score is 0 and therefore not on longterm anticoagulation. He will continue on Cartia XT 120mg  daily and Flecainide 50mg  BID.    3.  Obesity - I have encouraged him to get into a routine exercise program and cut back  on carbs and portions.     Medication Adjustments/Labs and Tests Ordered: Current medicines are reviewed at length with the patient today.  Concerns regarding medicines are outlined above.  No orders of the defined types were placed in this encounter.  No orders of the defined types were placed in this encounter.   Signed, Armanda Magicraci Turner, MD  05/29/2017 10:43 AM    [facility]

## 2017-05-29 NOTE — Addendum Note (Signed)
Addended by: Gunnar FusiKEMP, KATHRYN A on: 05/29/2017 04:18 PM   Modules accepted: Orders

## 2017-05-29 NOTE — Patient Instructions (Signed)
Medication Instructions:  Your provider recommends that you continue on your current medications as directed. Please refer to the Current Medication list given to you today.    Labwork: None  Testing/Procedures: None  Follow-Up: Your provider wants you to follow-up in: 6 months with Dr. Turner. You will receive a reminder letter in the mail two months in advance. If you don't receive a letter, please call our office to schedule the follow-up appointment.    Any Other Special Instructions Will Be Listed Below (If Applicable).     If you need a refill on your cardiac medications before your next appointment, please call your pharmacy.   

## 2017-06-09 ENCOUNTER — Telehealth: Payer: Self-pay | Admitting: *Deleted

## 2017-06-09 NOTE — Telephone Encounter (Signed)
-----   Message from Quintella Reichertraci R Turner, MD sent at 06/07/2017  6:02 AM EDT ----- Good AHI and compliance.  Continue current CPAP settings.

## 2017-06-09 NOTE — Telephone Encounter (Signed)
Informed patient of compliance results and verbalized understanding was indicated. Patient understands his events are in normal range. Patient understands he is in compliance with more than 4 hours of usage. Patient understands his settings will not change. Patient was grateful and thanked me for the call.

## 2017-06-10 ENCOUNTER — Telehealth: Payer: Self-pay | Admitting: *Deleted

## 2017-06-10 NOTE — Telephone Encounter (Signed)
-----   Message from Henrietta DineKathryn A Kemp, RN sent at 05/29/2017  4:10 PM EDT ----- Regarding: DME order New CPAP order placed. The patient [facility].  Thanks!

## 2017-06-10 NOTE — Telephone Encounter (Signed)
[name] patient does qualify for a new CPAP since 2013. Office notes and order for CPAP and supplies [facility]  [phone].

## 2017-06-20 ENCOUNTER — Telehealth: Payer: Self-pay | Admitting: *Deleted

## 2017-06-20 NOTE — Telephone Encounter (Signed)
  Reesa Chew, CMA  Stenson, Melissa  Cc: Sheron Nightingale        Spoke to this patient today and gave him the phone number to reach [facility] he states he will call today.  Thanks,  Coralee North

## 2017-06-20 NOTE — Telephone Encounter (Signed)
-----   Message from [facility] sent at 06/20/2017  9:50 AM EDT ----- Good morning! Fyi, we have not been able to make contact with this patient for his CPAP set-up.  Let us know if you have better luck.  He can call us at [phone] ext [phone] to schedule.  Thanks! Melissa

## 2017-11-15 ENCOUNTER — Other Ambulatory Visit: Payer: Self-pay | Admitting: Cardiology

## 2017-11-27 ENCOUNTER — Telehealth: Payer: Self-pay | Admitting: *Deleted

## 2017-11-27 NOTE — Telephone Encounter (Signed)
Patient has a 10 week follow up appointment scheduled for Tuesday Feb 10 2018. Patient understands he needs to keep this appointment for insurance compliance. Patient was grateful for the call and thanked me.

## 2018-01-07 ENCOUNTER — Telehealth: Payer: Self-pay | Admitting: *Deleted

## 2018-01-07 NOTE — Telephone Encounter (Signed)
-----   Message from Quintella Reichertraci R Turner, MD sent at 01/02/2018 11:16 PM EDT ----- Good AHI and compliance.  Continue current PAP settings.

## 2018-01-07 NOTE — Telephone Encounter (Signed)
Patient is aware and agreeable to AHI being within range at 1.4. Patient is aware and agreeable to being in compliance with machine usage Patient is aware and agreeable to no change in current pressures Confirmed follow up appointment for 02/10/18 at 9:40.

## 2018-01-22 ENCOUNTER — Encounter: Payer: Self-pay | Admitting: Cardiology

## 2018-02-05 ENCOUNTER — Telehealth: Payer: Self-pay | Admitting: *Deleted

## 2018-02-05 NOTE — Telephone Encounter (Signed)
Patient is aware and agreeable to AHI being within range at 1.1. Patient is aware and agreeable to being in compliance with machine usage. Patient is aware and agreeable to no change in current pressures.   

## 2018-02-05 NOTE — Telephone Encounter (Signed)
-----   Message from Quintella Reichert, MD sent at 01/29/2018  9:24 PM EDT ----- Good AHI and compliance.  Continue current PAP settings.

## 2018-02-10 ENCOUNTER — Ambulatory Visit: Payer: BLUE CROSS/BLUE SHIELD | Admitting: Cardiology

## 2018-02-10 ENCOUNTER — Encounter (INDEPENDENT_AMBULATORY_CARE_PROVIDER_SITE_OTHER): Payer: Self-pay

## 2018-02-10 ENCOUNTER — Encounter: Payer: Self-pay | Admitting: Cardiology

## 2018-02-10 VITALS — BP 130/76 | HR 77 | Ht 71.0 in | Wt 224.4 lb

## 2018-02-10 DIAGNOSIS — G4733 Obstructive sleep apnea (adult) (pediatric): Secondary | ICD-10-CM

## 2018-02-10 DIAGNOSIS — E669 Obesity, unspecified: Secondary | ICD-10-CM | POA: Diagnosis not present

## 2018-02-10 DIAGNOSIS — I480 Paroxysmal atrial fibrillation: Secondary | ICD-10-CM

## 2018-02-10 NOTE — Patient Instructions (Signed)
Medication Instructions:  Your physician recommends that you continue on your current medications as directed. Please refer to the Current Medication list given to you today.  If you need a refill on your cardiac medications, please contact your pharmacy first.  Labwork: None ordered   Testing/Procedures: None ordered   Follow-Up: Your physician wants you to follow-up in: 1 year with Dr. Turner. You will receive a reminder letter in the mail two months in advance. If you don't receive a letter, please call our office to schedule the follow-up appointment.  Any Other Special Instructions Will Be Listed Below (If Applicable).   Thank you for choosing [facility]    Rena Istvan Behar, RN  [phone]  If you need a refill on your cardiac medications before your next appointment, please call your pharmacy.   

## 2018-02-10 NOTE — Progress Notes (Signed)
Cardiology Office Note:    Date:  02/10/2018   ID:  Larry Mccormick, DOB April 27, 1961, MRN [id number]  PCP:  Daisy Floro, MD  Cardiologist:  No primary care provider on file.    Referring MD: Daisy Floro, MD   Chief Complaint  Patient presents with  . Atrial Fibrillation  . Sleep Apnea    History of Present Illness:    Larry Mccormick is a 57 y.o. male with a hx of OSA on CPAP, obesity and PAF on Flecainide.  Prior nuclear stress test 03/2016 showed no ischemia.  He is here today for followup and is doing well.  He denies any chest pain or pressure, SOB, DOE, PND, orthopnea, LE edema, dizziness, palpitations or syncope. He is compliant with his meds and is tolerating meds with no SE.  He is doing well with his CPAP device.  He tolerates the mask and feels the pressure is adequate.  Since going on CPAP He feels rested in the am and has no significant daytime sleepiness.  He denies any significant mouth or nasal dryness or nasal congestion.  He does not think that he snores.     Past Medical History:  Diagnosis Date  . Anxiety   . Diastolic dysfunction   . False positive stress test    cath 2012 with non obstructive CAD  . Fatigue 03/07/2016  . GERD (gastroesophageal reflux disease)    occ pepcid  . Hyperlipidemia   . Obesity (BMI 30-39.9)   . OSA (obstructive sleep apnea)    PSG AHI 31.71/hr now on CPAP at 9cm H2O  . Paroxysmal atrial fibrillation [facility])    08/2011 [facility]- transient, following with Dr. Mayford Knife  . Seasonal allergies   . Typical atrial flutter (HCC)   . Vertigo     Past Surgical History:  Procedure Laterality Date  . CARDIAC CATHETERIZATION  2012   nonobstructive ASCAD   . CHEST TUBE INSERTION  2012   right post a fall-no problems post  . COLONOSCOPY     no problems post  . KNEE ARTHROSCOPY Left 03/07/2014   Procedure: LEFT ARTHROSCOPY KNEE;  Surgeon: Nestor Lewandowsky, MD;  Location: [facility];  Service:  Orthopedics;  Laterality: Left;    Current Medications: Current Meds  Medication Sig  . aspirin EC 81 MG tablet Take 1 tablet (81 mg total) by mouth daily.  Marland Kitchen CARTIA XT 120 MG 24 hr capsule TAKE 1 CAPSULE BY MOUTH ONCE DAILY  . DiphenhydrAMINE HCl (BENADRYL ALLERGY PO) Take 1 tablet by mouth daily as needed (allergies).   . famotidine (PEPCID) 20 MG tablet Take 20 mg by mouth daily as needed for heartburn or indigestion.   . flecainide (TAMBOCOR) 50 MG tablet TAKE ONE TABLET BY MOUTH TWICE DAILY  . Glucos-Chond-Hyal Ac-Ca Fructo (MOVE FREE JOINT HEALTH ADVANCE) TABS Take 1 tablet by mouth daily.  . meloxicam (MOBIC) 15 MG tablet Take 15 mg by mouth at bedtime.   . Omega-3 Fatty Acids (OMEGA-3 FISH OIL PO) Take 1 capsule by mouth daily.   . Red Yeast Rice 600 MG CAPS Take 1 capsule by mouth 2 (two) times daily.     Allergies:   Succinylcholine chloride   Social History   Socioeconomic History  . Marital status: Single    Spouse name: Not on file  . Number of children: Not on file  . Years of education: Not on file  . Highest education level: Not on file  Occupational  History  . Not on file  Social Needs  . Financial resource strain: Not on file  . Food insecurity:    Worry: Not on file    Inability: Not on file  . Transportation needs:    Medical: Not on file    Non-medical: Not on file  Tobacco Use  . Smoking status: Never Smoker  . Smokeless tobacco: Never Used  Substance and Sexual Activity  . Alcohol use: No    Alcohol/week: 0.0 oz    Comment: occ wine  . Drug use: No  . Sexual activity: Not on file  Lifestyle  . Physical activity:    Days per week: Not on file    Minutes per session: Not on file  . Stress: Not on file  Relationships  . Social connections:    Talks on phone: Not on file    Gets together: Not on file    Attends religious service: Not on file    Active member of club or organization: Not on file    Attends meetings of clubs or organizations:  Not on file    Relationship status: Not on file  Other Topics Concern  . 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     Family History: The patient's family history includes Coronary artery disease in his unknown relative; Diabetes type II in his maternal grandfather; Heart attack (age of onset: 60) in his father; Heart disease in his father.  ROS:   Please see the history of present illness.    ROS  All other systems reviewed and negative.   EKGs/Labs/Other Studies Reviewed:    The following studies were reviewed today: PAP download  EKG:  EKG is  ordered today.  The ekg ordered today demonstrates NSR at 70bpm with LAD, LVH by voltage, IRBBB  Recent Labs: No results found for requested labs within last 8760 hours.   Recent Lipid Panel No results found for: CHOL, TRIG, HDL, CHOLHDL, VLDL, LDLCALC, LDLDIRECT  Physical Exam:    VS:  BP 130/76   Pulse 77   Ht  (1.803 m)   Wt 224 lb 6.4 oz (101.8 kg)   SpO2 95%   BMI 31.30 kg/m     Wt Readings from Last 3 Encounters:  02/10/18 224 lb 6.4 oz (101.8 kg)  05/29/17 226 lb 12.8 oz (102.9 kg)  11/05/16 229 lb 3.2 oz (104 kg)     GEN:  Well nourished, well developed in no acute distress HEENT: Normal NECK: No JVD; No carotid bruits LYMPHATICS: No lymphadenopathy CARDIAC: RRR, no murmurs, rubs, gallops RESPIRATORY:  Clear to auscultation without rales, wheezing or rhonchi  ABDOMEN: Soft, non-tender, non-distended MUSCULOSKELETAL:  No edema; No deformity  SKIN: Warm and dry NEUROLOGIC:  Alert and oriented x 3 PSYCHIATRIC:  Normal affect   ASSESSMENT:    1. PAF (paroxysmal atrial fibrillation) (HCC)   2. OSA (obstructive sleep apnea)   3. Obesity (BMI 30-39.9)    PLAN:    In order of problems listed above:  1.  PAF -he is maintaining normal sinus rhythm on exam today.  He will continue on Cartia XT 120 mg daily, flecainide 50 mg twice daily.  His CHADS2VASC  score is 0 and therefore not on long-term   2.  OSA - the patient is tolerating PAP therapy well without any problems. The PAP download was reviewed today and showed an AHI of 1/hr on 9 cm  H2O with 97% compliance in using more than 4 hours nightly.  The patient has been using and benefiting from PAP use and will continue to benefit from therapy.   3.  Obesity - I have encouraged him to get into a routine exercise program and cut back on carbs and portions.    Medication Adjustments/Labs and Tests Ordered: Current medicines are reviewed at length with the patient today.  Concerns regarding medicines are outlined above.  No orders of the defined types were placed in this encounter.  No orders of the defined types were placed in this encounter.   Signed, Armanda Magic, MD  02/10/2018 9:59 AM    [facility]

## 2018-05-15 ENCOUNTER — Other Ambulatory Visit: Payer: Self-pay | Admitting: Cardiology

## 2018-05-21 ENCOUNTER — Telehealth: Payer: Self-pay | Admitting: Cardiology

## 2018-05-21 NOTE — Telephone Encounter (Signed)
Pt c/o medication issue:  1. Name of Medication: PARTIA 120mg   2. How are you currently taking this medication (dosage and times per day)? 1 a day  3. Are you having a reaction (difficulty breathing--STAT)? Very tired can not work  4. What is your medication issue? Pt wants to talk to someone about changing this medication.

## 2018-05-21 NOTE — Telephone Encounter (Signed)
Spoke with patient about fatigue. He has been fatigued for 2 years now and is just done being tired. He believes diltiazem could be the root of the problem. He gets 7 hours of sleep at night, but wakes up to use the bathroom. He denies any problems with is CPAP. The patient has difficulty getting up and staying awake.   Sending to Dr. Mayford Knifeurner

## 2018-05-22 NOTE — Telephone Encounter (Signed)
Diltiazem is the least likely of meds for afib to cause fatigue and he is on the lowest dose.  Needs to be on CCB or BB with flecainide.  Could try Bystolic 2.5mg  daily or try taking Cardizem at night

## 2018-05-25 NOTE — Telephone Encounter (Signed)
Spoke with patient, he stated he will continue to take diltiazem and change it to night time to see if this helps. He will call back in a few days if he does not experience any changes.

## 2018-08-14 ENCOUNTER — Telehealth: Payer: Self-pay

## 2018-08-14 ENCOUNTER — Telehealth: Payer: Self-pay | Admitting: Cardiology

## 2018-08-14 NOTE — Telephone Encounter (Signed)
DR. Mayford Mccormick can pt be off Asprin 5 days prior procedure?  Pt has not had a fib and no chest pain.         Primary Cardiologist: Larry Magicraci Turner, MD  Chart reviewed as part of pre-operative protocol coverage. Given past medical history and time since last visit, based on ACC/AHA guidelines, Larry Mccormick would be at acceptable risk for the planned procedure without further cardiovascular testing.   I will route this recommendation to the requesting party via Epic fax function and remove from pre-op pool.  Please call with questions.  Larry BoozerLaura Truc Winfree, NP 08/14/2018, 4:34 PM

## 2018-08-14 NOTE — Telephone Encounter (Signed)
New Message:   Patient calling to speak with the nurse. Please call back.

## 2018-08-14 NOTE — Telephone Encounter (Signed)
Returned call to patient who states that he may need surgery on his arm and wanted to know what he needed to do to get clearance. Made patient aware that the doctor that would be doing the surgery would need to fax over a clearance request stating the type of surgery, type of anesthesia, and if any medications needed to be held. Patient verbalized understanding and thanked me for the call.

## 2018-08-14 NOTE — Telephone Encounter (Signed)
   [facility] Pre-operative Risk Assessment    Request for surgical clearance:  1. What type of surgery is being performed? LEFT SHOULDER ARTHROSCOPIC ROTATOR CUFF REPAIR/BICEPS TENOTOMY/OR TENODESIS  2. When is this surgery scheduled? 08/25/2018   3. Are there any medications that need to be held prior to surgery and how long? ASA  4. Practice name and name of physician performing surgery? [facility]; DR. Tamera Punt   5. What is your office phone and fax number? P# [phone] F# [phone] ATTN: KASEY FITZGERALD   6. Anesthesia type (None, local, MAC, general) ? CHOICE   Larry Mccormick 08/14/2018, 3:46 PM  _________________________________________________________________   (provider comments below)

## 2018-08-15 NOTE — Telephone Encounter (Signed)
OK to be off ASA for surgery 

## 2018-08-17 NOTE — Telephone Encounter (Signed)
   Primary Cardiologist: Armanda Magicraci Turner, MD  Chart reviewed as part of pre-operative protocol coverage. Patient was contacted 08/17/2018 in reference to pre-operative risk assessment for pending surgery as outlined below.  Larry CarpenJonathan T Addair was last seen on 02/10/2018 by Dr. Mayford Knifeurner.  Since that day, Larry Mccormick has done well without any recurrent sign of atrial fibrillation.  Therefore, based on ACC/AHA guidelines, the patient would be at acceptable risk for the planned procedure without further cardiovascular testing.   I will route this recommendation to the requesting party via Epic fax function and remove from pre-op pool.  Please call with questions. He may hold aspirin for 5-7 days prior to the procedure and restart after the procedure as soon as possible.   Azalee CourseHao Kyleigha Markert, [place] 08/17/2018, 4:51 PM

## 2018-11-30 ENCOUNTER — Other Ambulatory Visit: Payer: Self-pay | Admitting: Cardiology

## 2019-02-24 ENCOUNTER — Other Ambulatory Visit: Payer: Self-pay | Admitting: Cardiology

## 2020-02-13 ENCOUNTER — Other Ambulatory Visit: Payer: Self-pay | Admitting: Cardiology

## 2020-05-27 ENCOUNTER — Other Ambulatory Visit: Payer: Self-pay | Admitting: Cardiology

## 2020-08-21 ENCOUNTER — Other Ambulatory Visit: Payer: Self-pay | Admitting: Cardiology
